# Patient Record
Sex: Male | Born: 1975 | Race: Black or African American | Hispanic: No | Marital: Single | Smoking: Current every day smoker
Health system: Southern US, Community
[De-identification: ages and names within clinical notes are randomized; demographics above are authoritative.]

## PROBLEM LIST (undated history)

## (undated) DIAGNOSIS — S46009A Unspecified injury of muscle(s) and tendon(s) of the rotator cuff of unspecified shoulder, initial encounter: Secondary | ICD-10-CM

## (undated) DIAGNOSIS — K2970 Gastritis, unspecified, without bleeding: Secondary | ICD-10-CM

## (undated) DIAGNOSIS — B9681 Helicobacter pylori [H. pylori] as the cause of diseases classified elsewhere: Secondary | ICD-10-CM

## (undated) DIAGNOSIS — K219 Gastro-esophageal reflux disease without esophagitis: Secondary | ICD-10-CM

## (undated) DIAGNOSIS — IMO0001 Reserved for inherently not codable concepts without codable children: Secondary | ICD-10-CM

## (undated) HISTORY — DX: Helicobacter pylori (H. pylori) as the cause of diseases classified elsewhere: B96.81

## (undated) HISTORY — PX: SHOULDER SURGERY: SHX246

## (undated) HISTORY — DX: Unspecified injury of muscle(s) and tendon(s) of the rotator cuff of unspecified shoulder, initial encounter: S46.009A

## (undated) HISTORY — DX: Reserved for inherently not codable concepts without codable children: IMO0001

## (undated) HISTORY — DX: Gastro-esophageal reflux disease without esophagitis: K21.9

## (undated) HISTORY — DX: Gastritis, unspecified, without bleeding: K29.70

---

## 1998-04-20 HISTORY — PX: CHOLECYSTECTOMY: SHX55

## 2001-01-03 ENCOUNTER — Emergency Department (HOSPITAL_COMMUNITY): Admission: EM | Admit: 2001-01-03 | Discharge: 2001-01-03 | Payer: Self-pay | Admitting: *Deleted

## 2001-05-02 ENCOUNTER — Emergency Department (HOSPITAL_COMMUNITY): Admission: EM | Admit: 2001-05-02 | Discharge: 2001-05-02 | Payer: Self-pay | Admitting: Emergency Medicine

## 2001-05-02 ENCOUNTER — Encounter: Payer: Self-pay | Admitting: Emergency Medicine

## 2001-06-27 ENCOUNTER — Encounter: Payer: Self-pay | Admitting: *Deleted

## 2001-06-27 ENCOUNTER — Emergency Department (HOSPITAL_COMMUNITY): Admission: EM | Admit: 2001-06-27 | Discharge: 2001-06-27 | Payer: Self-pay | Admitting: *Deleted

## 2002-11-16 ENCOUNTER — Emergency Department (HOSPITAL_COMMUNITY): Admission: EM | Admit: 2002-11-16 | Discharge: 2002-11-16 | Payer: Self-pay | Admitting: Emergency Medicine

## 2003-06-05 ENCOUNTER — Emergency Department (HOSPITAL_COMMUNITY): Admission: EM | Admit: 2003-06-05 | Discharge: 2003-06-05 | Payer: Self-pay | Admitting: Emergency Medicine

## 2003-08-03 ENCOUNTER — Ambulatory Visit (HOSPITAL_COMMUNITY): Admission: RE | Admit: 2003-08-03 | Discharge: 2003-08-03 | Payer: Self-pay | Admitting: Internal Medicine

## 2004-01-11 ENCOUNTER — Encounter: Payer: Self-pay | Admitting: Orthopedic Surgery

## 2004-03-06 ENCOUNTER — Ambulatory Visit: Payer: Self-pay | Admitting: Gastroenterology

## 2004-03-07 ENCOUNTER — Ambulatory Visit: Payer: Self-pay | Admitting: Internal Medicine

## 2004-03-07 ENCOUNTER — Ambulatory Visit (HOSPITAL_COMMUNITY): Admission: RE | Admit: 2004-03-07 | Discharge: 2004-03-07 | Payer: Self-pay | Admitting: Internal Medicine

## 2004-03-11 ENCOUNTER — Ambulatory Visit (HOSPITAL_COMMUNITY): Admission: RE | Admit: 2004-03-11 | Discharge: 2004-03-11 | Payer: Self-pay | Admitting: Internal Medicine

## 2004-06-06 ENCOUNTER — Emergency Department (HOSPITAL_COMMUNITY): Admission: EM | Admit: 2004-06-06 | Discharge: 2004-06-06 | Payer: Self-pay | Admitting: *Deleted

## 2004-06-18 ENCOUNTER — Ambulatory Visit: Payer: Self-pay | Admitting: Internal Medicine

## 2004-07-29 ENCOUNTER — Ambulatory Visit: Payer: Self-pay | Admitting: Family Medicine

## 2004-08-08 ENCOUNTER — Ambulatory Visit (HOSPITAL_COMMUNITY): Admission: RE | Admit: 2004-08-08 | Discharge: 2004-08-08 | Payer: Self-pay | Admitting: Family Medicine

## 2004-08-29 ENCOUNTER — Ambulatory Visit: Payer: Self-pay | Admitting: Family Medicine

## 2004-10-21 ENCOUNTER — Emergency Department (HOSPITAL_COMMUNITY): Admission: EM | Admit: 2004-10-21 | Discharge: 2004-10-22 | Payer: Self-pay | Admitting: *Deleted

## 2004-10-24 ENCOUNTER — Ambulatory Visit: Payer: Self-pay | Admitting: Family Medicine

## 2004-11-17 ENCOUNTER — Ambulatory Visit: Payer: Self-pay | Admitting: Internal Medicine

## 2004-11-24 ENCOUNTER — Ambulatory Visit: Payer: Self-pay | Admitting: Family Medicine

## 2004-12-30 ENCOUNTER — Ambulatory Visit (HOSPITAL_COMMUNITY): Admission: RE | Admit: 2004-12-30 | Discharge: 2004-12-30 | Payer: Self-pay | Admitting: Internal Medicine

## 2004-12-30 ENCOUNTER — Ambulatory Visit: Payer: Self-pay | Admitting: Internal Medicine

## 2005-01-23 ENCOUNTER — Ambulatory Visit: Payer: Self-pay | Admitting: Family Medicine

## 2005-02-11 ENCOUNTER — Other Ambulatory Visit: Admission: RE | Admit: 2005-02-11 | Discharge: 2005-02-11 | Payer: Self-pay | Admitting: Dermatology

## 2005-04-24 ENCOUNTER — Ambulatory Visit: Payer: Self-pay | Admitting: Family Medicine

## 2005-05-18 ENCOUNTER — Ambulatory Visit (HOSPITAL_COMMUNITY): Admission: RE | Admit: 2005-05-18 | Discharge: 2005-05-18 | Payer: Self-pay | Admitting: Family Medicine

## 2005-08-13 ENCOUNTER — Ambulatory Visit: Payer: Self-pay | Admitting: Internal Medicine

## 2005-11-16 ENCOUNTER — Emergency Department (HOSPITAL_COMMUNITY): Admission: EM | Admit: 2005-11-16 | Discharge: 2005-11-16 | Payer: Self-pay | Admitting: Emergency Medicine

## 2006-04-20 DIAGNOSIS — S46009A Unspecified injury of muscle(s) and tendon(s) of the rotator cuff of unspecified shoulder, initial encounter: Secondary | ICD-10-CM

## 2006-04-20 HISTORY — DX: Unspecified injury of muscle(s) and tendon(s) of the rotator cuff of unspecified shoulder, initial encounter: S46.009A

## 2006-04-20 HISTORY — PX: ROTATOR CUFF REPAIR: SHX139

## 2006-05-12 ENCOUNTER — Emergency Department (HOSPITAL_COMMUNITY): Admission: EM | Admit: 2006-05-12 | Discharge: 2006-05-12 | Payer: Self-pay | Admitting: Emergency Medicine

## 2006-08-11 ENCOUNTER — Ambulatory Visit: Payer: Self-pay | Admitting: Internal Medicine

## 2006-11-29 ENCOUNTER — Emergency Department (HOSPITAL_COMMUNITY): Admission: EM | Admit: 2006-11-29 | Discharge: 2006-11-29 | Payer: Self-pay | Admitting: Emergency Medicine

## 2006-12-02 ENCOUNTER — Ambulatory Visit: Payer: Self-pay | Admitting: Orthopedic Surgery

## 2006-12-07 ENCOUNTER — Encounter (HOSPITAL_COMMUNITY): Admission: RE | Admit: 2006-12-07 | Discharge: 2007-01-06 | Payer: Self-pay | Admitting: Orthopedic Surgery

## 2006-12-09 ENCOUNTER — Ambulatory Visit: Payer: Self-pay | Admitting: Orthopedic Surgery

## 2007-01-13 ENCOUNTER — Ambulatory Visit: Payer: Self-pay | Admitting: Orthopedic Surgery

## 2007-01-19 ENCOUNTER — Ambulatory Visit (HOSPITAL_COMMUNITY): Admission: RE | Admit: 2007-01-19 | Discharge: 2007-01-19 | Payer: Self-pay | Admitting: Orthopedic Surgery

## 2007-01-25 ENCOUNTER — Ambulatory Visit: Payer: Self-pay | Admitting: Orthopedic Surgery

## 2007-01-26 ENCOUNTER — Encounter: Payer: Self-pay | Admitting: Orthopedic Surgery

## 2007-01-26 DIAGNOSIS — M75120 Complete rotator cuff tear or rupture of unspecified shoulder, not specified as traumatic: Secondary | ICD-10-CM

## 2007-01-26 HISTORY — DX: Complete rotator cuff tear or rupture of unspecified shoulder, not specified as traumatic: M75.120

## 2007-02-04 ENCOUNTER — Ambulatory Visit: Payer: Self-pay | Admitting: Orthopedic Surgery

## 2007-02-04 ENCOUNTER — Ambulatory Visit (HOSPITAL_COMMUNITY): Admission: RE | Admit: 2007-02-04 | Discharge: 2007-02-04 | Payer: Self-pay | Admitting: Orthopedic Surgery

## 2007-02-08 ENCOUNTER — Encounter (HOSPITAL_COMMUNITY): Admission: RE | Admit: 2007-02-08 | Discharge: 2007-03-10 | Payer: Self-pay | Admitting: Orthopedic Surgery

## 2007-02-08 ENCOUNTER — Ambulatory Visit: Payer: Self-pay | Admitting: Orthopedic Surgery

## 2007-02-08 ENCOUNTER — Encounter (INDEPENDENT_AMBULATORY_CARE_PROVIDER_SITE_OTHER): Payer: Self-pay | Admitting: *Deleted

## 2007-02-08 DIAGNOSIS — M24119 Other articular cartilage disorders, unspecified shoulder: Secondary | ICD-10-CM

## 2007-02-08 HISTORY — DX: Other articular cartilage disorders, unspecified shoulder: M24.119

## 2007-02-23 ENCOUNTER — Encounter: Payer: Self-pay | Admitting: Orthopedic Surgery

## 2007-03-01 ENCOUNTER — Encounter: Payer: Self-pay | Admitting: Orthopedic Surgery

## 2007-03-08 ENCOUNTER — Encounter: Payer: Self-pay | Admitting: Orthopedic Surgery

## 2007-03-10 ENCOUNTER — Ambulatory Visit: Payer: Self-pay | Admitting: Orthopedic Surgery

## 2007-03-14 ENCOUNTER — Encounter (HOSPITAL_COMMUNITY): Admission: RE | Admit: 2007-03-14 | Discharge: 2007-04-13 | Payer: Self-pay | Admitting: Orthopedic Surgery

## 2007-03-15 ENCOUNTER — Encounter: Payer: Self-pay | Admitting: Orthopedic Surgery

## 2007-04-21 ENCOUNTER — Encounter: Payer: Self-pay | Admitting: Family Medicine

## 2007-11-17 ENCOUNTER — Emergency Department (HOSPITAL_COMMUNITY): Admission: EM | Admit: 2007-11-17 | Discharge: 2007-11-17 | Payer: Self-pay | Admitting: Emergency Medicine

## 2008-07-25 ENCOUNTER — Emergency Department (HOSPITAL_COMMUNITY): Admission: EM | Admit: 2008-07-25 | Discharge: 2008-07-25 | Payer: Self-pay | Admitting: Internal Medicine

## 2008-11-01 ENCOUNTER — Emergency Department (HOSPITAL_COMMUNITY): Admission: EM | Admit: 2008-11-01 | Discharge: 2008-11-01 | Payer: Self-pay | Admitting: Emergency Medicine

## 2008-11-02 ENCOUNTER — Ambulatory Visit: Payer: Self-pay | Admitting: Gastroenterology

## 2008-11-02 DIAGNOSIS — R079 Chest pain, unspecified: Secondary | ICD-10-CM

## 2008-11-06 ENCOUNTER — Ambulatory Visit (HOSPITAL_COMMUNITY): Admission: RE | Admit: 2008-11-06 | Discharge: 2008-11-06 | Payer: Self-pay | Admitting: Gastroenterology

## 2008-11-06 ENCOUNTER — Encounter: Payer: Self-pay | Admitting: Gastroenterology

## 2008-11-06 ENCOUNTER — Ambulatory Visit: Payer: Self-pay | Admitting: Gastroenterology

## 2009-01-10 ENCOUNTER — Encounter (INDEPENDENT_AMBULATORY_CARE_PROVIDER_SITE_OTHER): Payer: Self-pay | Admitting: *Deleted

## 2009-03-13 DIAGNOSIS — K219 Gastro-esophageal reflux disease without esophagitis: Secondary | ICD-10-CM

## 2009-03-13 DIAGNOSIS — Z8719 Personal history of other diseases of the digestive system: Secondary | ICD-10-CM

## 2009-03-13 DIAGNOSIS — F17200 Nicotine dependence, unspecified, uncomplicated: Secondary | ICD-10-CM

## 2009-03-13 HISTORY — DX: Personal history of other diseases of the digestive system: Z87.19

## 2009-03-13 HISTORY — DX: Gastro-esophageal reflux disease without esophagitis: K21.9

## 2009-03-19 ENCOUNTER — Ambulatory Visit: Payer: Self-pay | Admitting: Gastroenterology

## 2009-03-19 DIAGNOSIS — A048 Other specified bacterial intestinal infections: Secondary | ICD-10-CM

## 2009-03-19 HISTORY — DX: Other specified bacterial intestinal infections: A04.8

## 2009-09-10 ENCOUNTER — Emergency Department (HOSPITAL_COMMUNITY): Admission: EM | Admit: 2009-09-10 | Discharge: 2009-09-10 | Payer: Self-pay | Admitting: Emergency Medicine

## 2009-10-01 ENCOUNTER — Telehealth (INDEPENDENT_AMBULATORY_CARE_PROVIDER_SITE_OTHER): Payer: Self-pay

## 2009-10-01 ENCOUNTER — Ambulatory Visit: Payer: Self-pay | Admitting: Gastroenterology

## 2009-10-01 DIAGNOSIS — B370 Candidal stomatitis: Secondary | ICD-10-CM | POA: Insufficient documentation

## 2010-03-06 ENCOUNTER — Encounter (INDEPENDENT_AMBULATORY_CARE_PROVIDER_SITE_OTHER): Payer: Self-pay | Admitting: *Deleted

## 2010-03-15 ENCOUNTER — Emergency Department (HOSPITAL_COMMUNITY): Admission: EM | Admit: 2010-03-15 | Discharge: 2010-03-16 | Payer: Self-pay | Admitting: Emergency Medicine
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
## 2010-05-20 NOTE — Letter (Signed)
Summary: Recall Office Visit  [facility]  [address]   [place], [place] [address]   Phone: [phone]  Fax: [phone]      March 06, 2010   Billy Lynn [address] [place], [place]  [address] 10/27/1975   Dear Mr. KIDD,   According to our records, it is time for you to schedule a follow-up office visit with [place].   At your convenience, please call [phone] to schedule an office visit. If you have any questions, concerns, or feel that this letter is in error, we would appreciate your call.   Sincerely,    Diana Eves  [facility] Ph: [phone]   Fax: [phone]

## 2010-05-20 NOTE — Progress Notes (Signed)
Summary: CELL NUMBER IF NEEDED...[phone]  Phone Note Call from Patient   Summary of Call: Pt said his cell number is sometimes a better number to reach him at.   [phone]. Initial call taken by: Cloria Spring LPN,  October 01, 2009 2:16 PM     Appended Document: CELL NUMBER IF NEEDED...[phone] new cell number is in IDX

## 2010-05-20 NOTE — Letter (Signed)
Summary: rpc chart  rpc chart   Imported By: Curtis Sites 02/05/2010 11:29:39  _____________________________________________________________________  External Attachment:    Type:   Image     Comment:   External Document

## 2010-06-20 ENCOUNTER — Encounter: Payer: Self-pay | Admitting: Orthopedic Surgery

## 2010-06-24 ENCOUNTER — Ambulatory Visit: Payer: Self-pay | Admitting: Orthopedic Surgery

## 2010-06-24 ENCOUNTER — Encounter: Payer: Self-pay | Admitting: Orthopedic Surgery

## 2010-07-07 LAB — CBC
HCT: 46.1 % (ref 39.0–52.0)
Platelets: 176 10*3/uL (ref 150–400)
RBC: 5.98 MIL/uL — ABNORMAL HIGH (ref 4.22–5.81)
WBC: 6.7 10*3/uL (ref 4.0–10.5)

## 2010-07-07 LAB — COMPREHENSIVE METABOLIC PANEL
Alkaline Phosphatase: 58 U/L (ref 39–117)
BUN: 13 mg/dL (ref 6–23)
Calcium: 9.3 mg/dL (ref 8.4–10.5)
GFR calc Af Amer: >60 mL/min (ref >60)
GFR calc non Af Amer: >60 mL/min (ref >60)
Total Bilirubin: 0.5 mg/dL (ref 0.3–1.2)

## 2010-07-07 LAB — URINALYSIS, ROUTINE W REFLEX MICROSCOPIC
Hgb urine dipstick: NEGATIVE
Nitrite: NEGATIVE
Protein, ur: NEGATIVE mg/dL
Specific Gravity, Urine: 1.02 (ref 1.005–1.030)
Urobilinogen, UA: 0.2 mg/dL (ref 0.0–1.0)

## 2010-07-07 LAB — LIPASE, BLOOD: Lipase: 29 U/L (ref 11–59)

## 2010-07-07 LAB — DIFFERENTIAL
Basophils Absolute: 0.1 10*3/uL (ref 0.0–0.1)
Basophils Relative: 1 % (ref 0–1)
Eosinophils Relative: 5 % (ref 0–5)
Lymphs Abs: 2.6 10*3/uL (ref 0.7–4.0)
Monocytes Absolute: 0.4 10*3/uL (ref 0.1–1.0)
Neutrophils Relative %: 49 % (ref 43–77)

## 2010-07-09 ENCOUNTER — Encounter: Payer: Self-pay | Admitting: Orthopedic Surgery

## 2010-07-15 ENCOUNTER — Ambulatory Visit (INDEPENDENT_AMBULATORY_CARE_PROVIDER_SITE_OTHER): Payer: BC Managed Care – PPO | Admitting: Orthopedic Surgery

## 2010-07-15 ENCOUNTER — Encounter: Payer: Self-pay | Admitting: Orthopedic Surgery

## 2010-07-15 DIAGNOSIS — M25539 Pain in unspecified wrist: Secondary | ICD-10-CM

## 2010-07-15 DIAGNOSIS — S63599A Other specified sprain of unspecified wrist, initial encounter: Secondary | ICD-10-CM

## 2010-07-15 MED ORDER — NABUMETONE 500 MG PO TABS: 500.0000 mg | ORAL_TABLET | Freq: Two times a day (BID) | ORAL | Status: DC

## 2010-07-15 NOTE — Patient Instructions (Signed)
Brace x  6 weeks   Take medication x 6 weeks
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
## 2010-08-26 ENCOUNTER — Ambulatory Visit: Payer: BC Managed Care – PPO | Admitting: Orthopedic Surgery

## 2010-09-02 NOTE — Op Note (Signed)
Billy Lynn, HENDERSHOTT              ACCOUNT NO.:  [id number]   MEDICAL RECORD NO.:  [id number]          PATIENT TYPE:  AMB   LOCATION:  DAY                           FACILITY:  [facility]   PHYSICIAN:  Kassie Mends, M.D.      DATE OF BIRTH:  1975/10/09   DATE OF PROCEDURE:  11/06/2008  DATE OF DISCHARGE:  11/06/2008                               OPERATIVE REPORT   REFERRING PHYSICIAN:  Tesfaye D. Felecia Shelling, MD   PROCEDURE:  Esophagogastroduodenoscopy with cold forceps biopsy.   INDICATION FOR EXAM:  Mr. Babington is a 35 year old male, who was referred  to me for reflux.  He is complaining of chest pain.  He has had chest  pain which has been secondary to nonerosive gastritis and  musculoskeletal chest wall pain, off and on since 2004.  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  He had EGD in 2006 with a 48-hour Bravo.  It showed a normal  esophagus and the Bravo study showed no evidence of uncontrolled  gastroesophageal reflux disease.  He had a cholecystectomy in 2000 for  similar symptoms, but his chest was not hurting as much.  He was asked  to take ibuprofen and his symptoms have improved.   FINDINGS:  1. Normal esophagus without evidence of Barrett's, mass, erosion,      ulceration, or stricture.  2. Patchy erythema in the antrum without evidence of erosion or      ulceration.  Biopsies obtained via cold forceps to evaluate for H.      pylori gastritis.  He also had the submucosal lesion.  It was      approximately 6-mm in size.  Biopsies obtained via cold forceps and      sent in a separate bottle.  3. Patchy erythema with occasional erosions seen in the duodenum.   DIAGNOSES:  1. Gastritis and duodenitis:  Differential includes NSAID-induced      gastrointestinal injury or Helicobacter pylori gastritis.  2. 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   RECOMMENDATIONS:  1. He should add omeprazole 20 mg daily for the next 3  months.  He is      to take ibuprofen 800 mg 3 times a day for 10 days total.  2. Will call him with the results of his biopsies.  3. He already has a follow up appointment to see me in 2 months.   MEDICATIONS:  1. Demerol 150 mg IV.  2. Versed 8 mg IV.  3. Phenergan 12.5 mg IV.   PROCEDURE TECHNIQUE:  Physical exam was performed.  Informed consent was  obtained from the patient after explaining the benefits, risks, and  alternatives to the procedure.  The patient was connected to the monitor  and placed in left lateral position.  Continuous oxygen was provided via  nasal cannula.  IV medicine administered through an indwelling cannula.  After administration of sedation, the patient's esophagus was intubated  and scope was advanced under direct visualization to the second portion  of the duodenum.  The scope  was removed slowly by carefully examining  the color, texture, anatomy, and integrity of the mucosa on the way out.  The patient was recovered in endoscopy and discharged home in  satisfactory condition.   PATH:  H. PYLORI GASTRITIS, RX: ABO bid x 10 days      Kassie Mends, M.D.  Electronically Signed     SM/MEDQ  D:  11/06/2008  T:  11/07/2008  Job:  [id number]   cc:   Tesfaye D. Felecia Shelling, MD  Fax: [phone]
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
## 2010-09-05 NOTE — H&P (Signed)
[name], Lynn              ACCOUNT NO.:  [id number]   MEDICAL RECORD NO.:  [id number]           PATIENT TYPE:   LOCATION:                                 FACILITY:   PHYSICIAN:  Lionel December, M.D.         DATE OF BIRTH:   DATE OF ADMISSION:  DATE OF DISCHARGE:  LH                                HISTORY & PHYSICAL   CHIEF COMPLAINT:  Gagging.   HISTORY OF PRESENT ILLNESS:  The patient is a 35 year old black male who  presents today for further evaluation of the above-stated symptoms.  He was  initially seen in February of 2004 for the same symptoms.  At that time, he  had a partial response with Nexium daily. He tells me that he gags after  most meals, especially fried, fatty foods.  Upon more description, it sounds  like he is heaving, but he is not actually having emesis.  He denies any  dysphagia or odynophagia.  He has some pain in the sternum region; however,  this seems to be worse with stretching or movement of his upper body.  He  complains of epigastric pain.  He is taking hydrocodone for this.  He denies  any typical heartburn symptoms.  His bowels move regularly, although he  tells me he has up to four formed to loose stools daily.  These occur  primarily postprandially.  He denies any melena or rectal bleeding.  He  denies any significant weight loss.   CURRENT MEDICATIONS:  Hydrocodone p.r.n.   ALLERGIES:  No known drug allergies.   PAST MEDICAL HISTORY:  Negative for chronic illnesses.   PAST SURGICAL HISTORY:  Cholecystectomy in 2000.  Family history negative  for colorectal cancer or chronic GI illnesses.   SOCIAL HISTORY:  He has a daughter.  He is employed by the [employer].  He smokes six cigarettes a day.  He denies any alcohol use.   REVIEW OF SYSTEMS:  See HPI for GI and for general.  CARDIOPULMONARY:  Denies shortness of breath or exertional chest pain.  Denies cough.   PHYSICAL EXAMINATION:  VITAL SIGNS:  Weight 150, height  5 feet, 6 inches.  Blood pressure 130/90, pulse 72.  GENERAL: A pleasant, well-nourished, well-developed black male, in no acute  distress.  SKIN:  Warm and dry, no jaundice.  HEENT:  Conjunctivae are pink.  Sclerae are nonicteric.  Oropharyngeal  mucosa moist and pink, no lesions, erythema or exudate.  No lymphadenopathy  or thyromegaly.  CHEST:  Lungs clear to auscultation.  CARDIAC:  Exam reveals regular rate and rhythm, normal S1 and S2.  No  murmurs, rubs or gallops.  ABDOMEN:  Positive bowel sounds, soft, nondistended.  Mild epigastric  tenderness to deep palpation.  He has a fullness noted in the right lower  quadrant just right at the midline.  No discrete mass is appreciated.  No  abdominal hernia, rebound tenderness or guarding.  EXTREMITIES:  No edema.   IMPRESSION:  1.  Billy Lynn is a pleasant 35 year old black gentleman with recurrent nausea  and gagging primarily after meals.  This has been intermittent in      nature now for almost two years.  Previously, his symptoms partially      responded to Nexium.  He does not have any typical heartburn symptoms.      However, would have to question if his symptoms are related to      gastroesophageal reflux disease.  Not noted above, he has gone back on      Nexium, but more or less on a p.r.n. basis, and has noted only mild      improvement in the symptoms.  In addition, he complains of postprandial      loose stools. Suspect he has IBS.  2.  On physical examination, he is noted to have a fullness in the right      lower quadrant lesion.  No discrete mass is appreciative.  This area is      nontender as well.  It needs to be further investigated via radiological      evaluation.   PLAN:  1.  Nexium 40 mg p.o. daily, #20 samples.  2.  Trial of NuLev, one sublingual q.a.c. p.r.n. #16, samples.  3.  CT of the abdomen and pelvis with IV and oral contrast.  4.  Met-7, LFT's, CBC, EGD in the near future.     Lesl   LL/MEDQ   D:  03/06/2004  T:  03/06/2004  Job:  [id number]

## 2010-09-05 NOTE — Op Note (Signed)
Billy Lynn, Billy Lynn              ACCOUNT NO.:  [id number]   MEDICAL RECORD NO.:  [id number]          PATIENT TYPE:  AMB   LOCATION:  DAY                           FACILITY:  [facility]   PHYSICIAN:  Lionel December, M.D.    DATE OF BIRTH:  15-Jun-1975   DATE OF PROCEDURE:  DATE OF DISCHARGE:                                 OPERATIVE REPORT   PROCEDURE:  Esophagogastroduodenoscopy with placement of Bravo device for 48-  hour pH study.   INDICATIONS:  [name] is a 35 year old African-American male with a several-  month history of burning chest and epigastric pain. He also has had  regurgitation. We felt that he has reflux esophagitis, although he has not  responded to double-dose PPI therapy along with entire reflux measures. He  also has undergone therapy for H. pylori gastritis but without any benefit.  He is, therefore, undergoing this study off PPI therapy. He has been off  Nexium for 7 days. Procedure and risks were reviewed the patient, informed  consent was obtained.   PREMEDICATION:  Cetacaine spray for oropharyngeal topical anesthesia Demerol  50 mg IV Versed 18 mg IV.   FINDINGS:  Procedure performed in endoscopy suite. The patient's vital signs  and O2 saturations were monitored during procedure and remained stable. The  patient was placed in the left lateral position and Olympus videoscope was  passed via oropharynx, without any difficulty, into esophagus.   ESOPHAGUS:  Mucosa of the esophagus normal throughout. GE junction was at 40  cm from the incisors. Pictures taken for the record.   STOMACH:  It was empty and distended very well on insufflation. Folds of the  proximal stomach were normal. Examination of the mucosa revealed some  mucosal lesion at antrum along the posterior wall with central depression or  umblication .  On previous study, it was felt to be a leiomyoma and he had  erosion on top. Picture was taken for the record and comparison made with  pictures from  the prior study. The pyloric channel was patent. Angularis,  fundus and cardia were normal.   DUODENUM:  Bulbar mucosa was normal. Scope was passed to the second part of  the duodenum where mucosa and folds were normal. Endoscope was withdrawn.   Bravo device already loaded onto the delivery system was calibrated and  passed via oropharynx, without any difficulty, into esophagus. The delivery  system was connected to suction which was maintained for 30 seconds. Plunger  was pushed to secure the Bravo device to esophageal mucosa and was turned  clockwise to disengage the delivery system and was gently pulled which was  then gradually withdrawn. Endoscope was passed again and Bravo device was in  good place. Endoscope was in good position. Endoscope was withdrawn. The  patient tolerated the procedure well.   FINAL DIAGNOSIS:  1.  Normal examination of the esophagus.  2.  Antral submucosal lesion with central depression or umblication felt to      be a leiomyoma size, stable since previous EGD of November2005.  3.  Bravo device placed at distal esophagus for pH study.  RECOMMENDATIONS:  1.  Patient advised not to take PPIs or H2B's while the study is being      carried out.  2.  He will keep a diary and he will return to short stay center in 48 hours      Lionel December, M.D.  Electronically Signed     NR/MEDQ  D:  12/30/2004  T:  12/31/2004  Job:  [id number]   cc:   Dorthula Rue. Early Chars, MD  Fax: [phone]

## 2010-09-05 NOTE — Op Note (Signed)
NAMEJAZ, Billy Lynn              ACCOUNT NO.:  [id number]   MEDICAL RECORD NO.:  [id number]          PATIENT TYPE:  AMB   LOCATION:  DAY                           FACILITY:  [facility]   PHYSICIAN:  Lionel December, M.D.    DATE OF BIRTH:  March 20, 1976   DATE OF PROCEDURE:  12/30/2004  DATE OF DISCHARGE:  12/30/2004                                 OPERATIVE REPORT   PROCEDURE:  Bravo pH monitoring study.   INDICATION:  [name] is a 35 year old Afro-American male who has symptoms of  gastroesophageal reflux disease. However, he has not responded to double  dose PPI. His EGD has essentially unremarkable. In the past, he has been  treated for H pylori gastritis. He had a Bravo device placed in his distal  esophagus per protocol two days ago. He has been off PPI for seven days.   FINDINGS:  Day #1 analysis:   Study duration 20 hours and 17 minutes.   Number of reflux episodes 25, all of which incurred in upright position.   Reflux episodes longer than 5 minutes, 0.   Duration of longest reflux episode 2 minutes. Time pH less than 4 is 12  minutes.   Fraction time pH less than 4 is 1.0%.   DeMeester score is 4.0.   Day #2 analysis.   Duration of study 22 hours and 46 minutes.   Number of reflux episodes in the study, 24, all of which occurred an upright  position.   Number of reflux episodes greater than 5 minutes, 0.   Duration of longest reflux episode was 1 minute.   Time PH below 4 is 11 minutes. Fraction time PH below 4 is 0.8%.   DeMeester score is 3.4.   Aggregate two-day analysis:   Study duration is 43 hours and 4 minutes.   Number of reflux episodes is 49, all of which occurred in upright position.   Number of reflux episodes greater than 5 minutes, 0.   Duration of longest reflux episode 2 minutes.   Time pH less than 4 is 24 minutes. Fraction time pH less than 4 is 0.9%.   The patient reported single episode of chest pain, and acid was documented  in his  esophagus.   He reported single episode of regurgitation, and acid was documented in his  esophagus.   He reported no episode of heartburn.   IMPRESSION:  This is a normal study. He has esophageal acid exposure within  physiologic range.   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   RECOMMENDATIONS:  The patient was reassured. Would let him go back on PPI  once a day for another 12-16 weeks, and he could use Levsin SL t.i.d. p.r.n.  chest pain.   If he remains symptomatic, would consider referral to tertiary center.      Lionel December, M.D.  Electronically Signed     NR/MEDQ  D:  01/06/2005  T:  01/06/2005  Job:  [id number]

## 2010-09-05 NOTE — Op Note (Signed)
NAMEDREYTON, ROESSNER              ACCOUNT NO.:  [id number]   MEDICAL RECORD NO.:  [id number]          PATIENT TYPE:  AMB   LOCATION:  DAY                           FACILITY:  [facility]   PHYSICIAN:  Lionel December, M.D.    DATE OF BIRTH:  1975/09/07   DATE OF PROCEDURE:  03/07/2004  DATE OF DISCHARGE:                                 OPERATIVE REPORT   PROCEDURE:  Esophagogastroduodenoscopy.   Billy Lynn is a 35 year old, African-American male with recurrent episodes of  postprandial gagging, epigastric pain.  He has had partial response to  Nexium.  This apparently has been going on for a few years intermittently.  He had a laparoscopic cholecystectomy five years ago without symptomatic  improvement.  He is undergoing a diagnostic EGD.  The procedure risks were  reviewed, and informed consent was obtained.   PREMEDICATION:  Cetacaine spray for pharyngeal topical anesthesia, Demerol  50 mg IV, Versed 12 mg IV.   FINDINGS:  Procedure performed in endoscopy suite.  The patient's vital  signs and O2 saturation were monitored during the procedure and remained  stable.  The patient was placed in the left lateral recumbent position and  the Olympus video scope was passed via oropharynx without any difficulty  into esophagus.   Esophagus:  Mucosa of the esophagus was normal throughout.  The  squamocolumnar junction was normal at 41 cm from the incisors.   Stomach:  It was empty and distended very well with insufflation.  Folds of  the proximal stomach were normal.  Examination of the mucosa revealed patchy  erythema and granularity at antrum.  There was a submucosal lesion about 10  x 15 mm with a single erosion over it.  This was along the posterior wall of  the antrum.  The appearance was typical of leiomyoma.  It was not biopsied.  The pyloric channel was patent.  Angularis, fundus, and cardia were examined  by retroflexing the scope and were normal.   Duodenum:  Examination of the bulb  revealed normal mucosa.  The scope was  passed to the second part of the duodenum, where mucosa and folds are  normal.  The endoscope was withdrawn.  The patient tolerated the procedure  well.   FINAL DIAGNOSES:  1.  Nonerosive antral gastritis.  2.  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  3.  As far as patient's symptoms are concerned, this may be a vagal response      or even a psychosomatic symptom.   RECOMMENDATIONS:  1.  A H. pylori serology will be checked today.  2.  Levbid one tablet before breakfast daily.  I have asked Tionne to keep      a record as to the frequency and the      setting of these episodes.  Unless he has side effects with Levbid, he      will stay on it for three months, at which time he will be evaluated in      the office.  3.  He should undergo a follow-up EGD  in two years to make sure antral      leiomyoma had not doubled in size.     Billy Lynn   NR/MEDQ  D:  03/07/2004  T:  03/07/2004  Job:  [id number]   cc:   Loel Dubonnet, M.D.

## 2010-09-09 ENCOUNTER — Ambulatory Visit: Payer: BC Managed Care – PPO | Admitting: Orthopedic Surgery

## 2010-09-24 ENCOUNTER — Ambulatory Visit (INDEPENDENT_AMBULATORY_CARE_PROVIDER_SITE_OTHER): Payer: BC Managed Care – PPO | Admitting: Orthopedic Surgery

## 2010-09-24 ENCOUNTER — Encounter: Payer: Self-pay | Admitting: Orthopedic Surgery

## 2010-09-24 DIAGNOSIS — M24139 Other articular cartilage disorders, unspecified wrist: Secondary | ICD-10-CM

## 2010-09-24 DIAGNOSIS — S6980XA Other specified injuries of unspecified wrist, hand and finger(s), initial encounter: Secondary | ICD-10-CM

## 2010-09-24 NOTE — Progress Notes (Signed)
Visit  LEFT wrist pain  Treatment edema tone  Complaints mild discomfort dorsum of the hand over the triquetral area  Normal wrist flexion no palpable tenderness no swelling normal neurovascular function no numbness no tingling normal grip strength.  Wrist is stable.  Impression ulnar-sided wrist pain control nabumetone  Followup as needed continue nabumetone.

## 2010-09-24 NOTE — Patient Instructions (Signed)
Keep taking medication  If pain gets worse give [place] a call to make appt

## 2011-01-14 ENCOUNTER — Other Ambulatory Visit: Payer: Self-pay

## 2011-01-14 MED ORDER — OMEPRAZOLE 20 MG PO TBEC: 20.0000 mg | DELAYED_RELEASE_TABLET | Freq: Every day | ORAL | Status: DC

## 2011-01-28 LAB — HEMOGLOBIN AND HEMATOCRIT, BLOOD: HCT: 48

## 2011-09-23 ENCOUNTER — Other Ambulatory Visit (HOSPITAL_COMMUNITY): Payer: Self-pay | Admitting: Internal Medicine

## 2011-09-23 ENCOUNTER — Ambulatory Visit (HOSPITAL_COMMUNITY)
Admission: RE | Admit: 2011-09-23 | Discharge: 2011-09-23 | Disposition: A | Discharge status: Home / Self Care | Payer: BC Managed Care – PPO | Source: Ambulatory Visit | Attending: Internal Medicine | Admitting: Internal Medicine

## 2011-09-23 DIAGNOSIS — M542 Cervicalgia: Secondary | ICD-10-CM | POA: Insufficient documentation

## 2011-10-28 ENCOUNTER — Ambulatory Visit (HOSPITAL_COMMUNITY)
Admission: RE | Admit: 2011-10-28 | Discharge: 2011-10-28 | Disposition: A | Discharge status: Home / Self Care | Payer: BC Managed Care – PPO | Source: Ambulatory Visit | Attending: Internal Medicine | Admitting: Internal Medicine

## 2011-10-28 DIAGNOSIS — M542 Cervicalgia: Secondary | ICD-10-CM | POA: Insufficient documentation

## 2011-10-28 DIAGNOSIS — M6281 Muscle weakness (generalized): Secondary | ICD-10-CM | POA: Insufficient documentation

## 2011-10-28 DIAGNOSIS — IMO0001 Reserved for inherently not codable concepts without codable children: Secondary | ICD-10-CM | POA: Insufficient documentation

## 2011-10-29 DIAGNOSIS — M7918 Myalgia, other site: Secondary | ICD-10-CM | POA: Insufficient documentation

## 2011-10-29 DIAGNOSIS — M542 Cervicalgia: Secondary | ICD-10-CM | POA: Insufficient documentation
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
## 2011-11-09 ENCOUNTER — Ambulatory Visit (HOSPITAL_COMMUNITY)
Admission: RE | Admit: 2011-11-09 | Discharge: 2011-11-09 | Disposition: A | Discharge status: Home / Self Care | Payer: BC Managed Care – PPO | Source: Ambulatory Visit | Attending: Physical Therapy | Admitting: Physical Therapy

## 2011-11-09 NOTE — Progress Notes (Signed)
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 No pain reported currently, however states it gets worse at night into the evening and is hard for him to sleep. Pain Assessment Currently in Pain?: No/denies   Exercise/Treatments Stretches Upper Trapezius Stretch: 2 reps;30 seconds Levator Stretch: 2 reps;30 seconds Machines for Strengthening UBE (Upper Arm Bike): 4' backward Theraband Exercises Scapula Retraction: 10 reps;Green Shoulder Extension: 10 reps;Green Rows: 10 reps;Red Standing Exercises Other Standing Exercises: corner elbow presses 10X5" holds Seated Exercises Neck Retraction: 10 reps    Modalities Modalities: Electrical Stimulation;Cryotherapy Moist Heat Therapy Moist Heat Location: Hand Cryotherapy Number Minutes Cryotherapy: 15 Minutes Cryotherapy Location: Neck Type of Cryotherapy: Ice pack Pharmacologist Location: L side of cervical region  Electrical Stimulation Action: IFES to decrease pain  Electrical Stimulation Parameters: 8.5-9 volts x15 minutes w/ice  Electrical Stimulation Goals: Pain  Physical Therapy Assessment and Plan PT Assessment and Plan Clinical Impression Statement: Pt. reported painfree at end of session.  Added tband with cues for posture/form.  No difficulties with other exercises, unable to recall/perform stretches correctly. PT Plan: Continue per POC; add prone exercises next visit.     Problem List Patient Active Problem List  Diagnosis  . HELICOBACTER PYLORI GASTRITIS  . CANDIDIASIS, ORAL  . CIGARETTE SMOKER  . GERD  . DISORDER,  ARTICULAR CRLTG , SHOULDER  . RUPTURE ROTATOR CUFF  . CHEST PAIN  . DYSPEPSIA, HX OF  . Neck pain  . Musculoskeletal pain    PT - End of Session Activity Tolerance: Patient tolerated treatment well General Behavior During Session: [facility] for tasks performed Cognition: [facility] for tasks performed   Lurena Nida, PTA/CLT 11/09/2011, 5:23 PM

## 2011-11-10 ENCOUNTER — Ambulatory Visit (HOSPITAL_COMMUNITY): Payer: BC Managed Care – PPO | Admitting: Physical Therapy

## 2011-11-16 ENCOUNTER — Ambulatory Visit (HOSPITAL_COMMUNITY)
Admission: RE | Admit: 2011-11-16 | Discharge: 2011-11-16 | Payer: BC Managed Care – PPO | Source: Ambulatory Visit | Attending: Internal Medicine | Admitting: Internal Medicine

## 2011-11-16 NOTE — Progress Notes (Signed)
Physical Therapy Treatment Patient Details  Name: Billy Lynn MRN: [id number] Date of Birth: 02-Sep-1975  Today's Date: 11/16/2011 Time: 6295-2841 PT Time Calculation (min): 48 min Charges:  therex 25', estim unattended X 1 unit Visit#: 3  of 8   Re-eval: 11/27/11   Subjective: Symptoms/Limitations Symptoms: 7/10 pain and stiffness from working today.  Pt. states the estim and ice are the only relief he's getting. Pain Assessment Currently in Pain?: Yes Pain Score:   7 Pain Location: Neck Pain Orientation: Left   Exercise/Treatments Machines for Strengthening UBE (Upper Arm Bike): 5' backward Theraband Exercises Scapula Retraction: 10 reps;Blue Shoulder Extension: 10 reps;Blue Rows: 10 reps;Blue Standing Exercises Other Standing Exercises: corner elbow presses 10X5" holds Seated Exercises Neck Retraction: 10 reps Prone Exercises Neck Retraction: 10 reps W Back: 10 reps Shoulder Extension: 10 reps Rows: 10 reps   Modalities Modalities: Archivist Stimulation Location: L side of cervical region Electrical Stimulation Action: IFES, hi/lo sweep to decrease pain.  Pt. positioned supine Electrical Stimulation Parameters: 8.5-9 volts x15 minutes  Electrical Stimulation Goals: Pain  Physical Therapy Assessment and Plan PT Assessment and Plan Clinical Impression Statement: Added prone exercises to strengthen postural/cervical muscles.  Pt. able to complete without c/o pain.  Pt. requested trying IFES without ice today; reported pain reduction at end of session.; able to move neck freely without complaint or facial grimacing throughout session. PT Plan: Continue POC; may try massage if no lasting relief from estim.     Problem List Patient Active Problem List  Diagnosis  . HELICOBACTER PYLORI GASTRITIS  . CANDIDIASIS, ORAL  . CIGARETTE SMOKER  . GERD  . DISORDER, ARTICULAR CRLTG , SHOULDER  . RUPTURE ROTATOR CUFF  .  CHEST PAIN  . DYSPEPSIA, HX OF  . Neck pain  . Musculoskeletal pain    PT - End of Session Activity Tolerance: Patient tolerated treatment well General Behavior During Session: [facility] for tasks performed Cognition: [facility] for tasks performed   Lurena Nida, PTA/CLT 11/16/2011, 5:45 PM

## 2011-11-18 ENCOUNTER — Ambulatory Visit (HOSPITAL_COMMUNITY): Payer: BC Managed Care – PPO | Admitting: Physical Therapy

## 2011-11-23 ENCOUNTER — Ambulatory Visit (HOSPITAL_COMMUNITY): Payer: BC Managed Care – PPO | Admitting: Physical Therapy

## 2011-11-25 ENCOUNTER — Ambulatory Visit (HOSPITAL_COMMUNITY): Payer: BC Managed Care – PPO | Admitting: Physical Therapy

## 2011-11-30 ENCOUNTER — Ambulatory Visit (HOSPITAL_COMMUNITY): Payer: BC Managed Care – PPO | Admitting: Physical Therapy

## 2011-12-02 ENCOUNTER — Ambulatory Visit (HOSPITAL_COMMUNITY): Payer: BC Managed Care – PPO | Admitting: Physical Therapy

## 2012-04-07 ENCOUNTER — Emergency Department (HOSPITAL_COMMUNITY)
Admission: EM | Admit: 2012-04-07 | Discharge: 2012-04-07 | Disposition: A | Discharge status: Home / Self Care | Payer: BC Managed Care – PPO | Attending: Emergency Medicine | Admitting: Emergency Medicine

## 2012-04-07 ENCOUNTER — Encounter (HOSPITAL_COMMUNITY): Payer: Self-pay | Admitting: *Deleted

## 2012-04-07 DIAGNOSIS — K219 Gastro-esophageal reflux disease without esophagitis: Secondary | ICD-10-CM | POA: Insufficient documentation

## 2012-04-07 DIAGNOSIS — R300 Dysuria: Secondary | ICD-10-CM | POA: Insufficient documentation

## 2012-04-07 DIAGNOSIS — Z87828 Personal history of other (healed) physical injury and trauma: Secondary | ICD-10-CM | POA: Insufficient documentation

## 2012-04-07 DIAGNOSIS — Z79899 Other long term (current) drug therapy: Secondary | ICD-10-CM | POA: Insufficient documentation

## 2012-04-07 DIAGNOSIS — F17200 Nicotine dependence, unspecified, uncomplicated: Secondary | ICD-10-CM | POA: Insufficient documentation

## 2012-04-07 LAB — URINALYSIS, ROUTINE W REFLEX MICROSCOPIC
Bilirubin Urine: NEGATIVE
Hgb urine dipstick: NEGATIVE
Ketones, ur: NEGATIVE mg/dL

## 2012-04-07 NOTE — ED Notes (Signed)
"  feels funny when I Pee"  Denies d/c alert,  No fever or chills
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
## 2012-04-07 NOTE — ED Provider Notes (Signed)
Medical screening examination/treatment/procedure(s) were performed by non-physician practitioner and as supervising physician I was immediately available for consultation/collaboration.  Jones Skene, M.D.     Jones Skene, MD 04/07/12 1857

## 2012-04-07 NOTE — ED Notes (Signed)
Instructions and f/u information given/reviewed.  Verbalizes understanding.

## 2012-04-09 LAB — URINE CULTURE: Culture: NO GROWTH

## 2012-05-30 ENCOUNTER — Emergency Department (HOSPITAL_COMMUNITY)
Admission: EM | Admit: 2012-05-30 | Discharge: 2012-05-30 | Disposition: A | Discharge status: Home / Self Care | Payer: BC Managed Care – PPO | Attending: Emergency Medicine | Admitting: Emergency Medicine

## 2012-05-30 ENCOUNTER — Emergency Department (HOSPITAL_COMMUNITY): Payer: BC Managed Care – PPO

## 2012-05-30 ENCOUNTER — Encounter (HOSPITAL_COMMUNITY): Payer: Self-pay

## 2012-05-30 DIAGNOSIS — Z87828 Personal history of other (healed) physical injury and trauma: Secondary | ICD-10-CM | POA: Insufficient documentation

## 2012-05-30 DIAGNOSIS — Z8719 Personal history of other diseases of the digestive system: Secondary | ICD-10-CM | POA: Insufficient documentation

## 2012-05-30 DIAGNOSIS — M542 Cervicalgia: Secondary | ICD-10-CM | POA: Insufficient documentation

## 2012-05-30 DIAGNOSIS — F17200 Nicotine dependence, unspecified, uncomplicated: Secondary | ICD-10-CM | POA: Insufficient documentation

## 2012-05-30 DIAGNOSIS — G8929 Other chronic pain: Secondary | ICD-10-CM | POA: Insufficient documentation

## 2012-05-30 DIAGNOSIS — Z7982 Long term (current) use of aspirin: Secondary | ICD-10-CM | POA: Insufficient documentation

## 2012-05-30 LAB — URINE MICROSCOPIC-ADD ON

## 2012-05-30 LAB — URINALYSIS, ROUTINE W REFLEX MICROSCOPIC
Glucose, UA: NEGATIVE mg/dL
Leukocytes, UA: NEGATIVE
Protein, ur: NEGATIVE mg/dL
pH: 6 (ref 5.0–8.0)

## 2012-05-30 MED ORDER — IBUPROFEN 800 MG PO TABS
800.0000 mg | ORAL_TABLET | Freq: Once | ORAL | Status: AC
  Administered 2012-05-30: 800 mg via ORAL
  Filled 2012-05-30: qty 1

## 2012-05-30 MED ORDER — IBUPROFEN 800 MG PO TABS: 800.0000 mg | ORAL_TABLET | Freq: Three times a day (TID) | ORAL | Status: DC
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
## 2012-05-30 NOTE — ED Notes (Signed)
Pt reports pain to his upper neck and lower back for a week.  Pt denies any new or old injury.

## 2013-01-20 ENCOUNTER — Encounter (HOSPITAL_COMMUNITY): Payer: Self-pay | Admitting: Emergency Medicine

## 2013-01-20 ENCOUNTER — Emergency Department (HOSPITAL_COMMUNITY)
Admission: EM | Admit: 2013-01-20 | Discharge: 2013-01-20 | Disposition: A | Discharge status: Home / Self Care | Payer: BC Managed Care – PPO | Attending: Emergency Medicine | Admitting: Emergency Medicine

## 2013-01-20 ENCOUNTER — Emergency Department (HOSPITAL_COMMUNITY): Payer: BC Managed Care – PPO

## 2013-01-20 DIAGNOSIS — Z7982 Long term (current) use of aspirin: Secondary | ICD-10-CM | POA: Insufficient documentation

## 2013-01-20 DIAGNOSIS — Z87828 Personal history of other (healed) physical injury and trauma: Secondary | ICD-10-CM | POA: Insufficient documentation

## 2013-01-20 DIAGNOSIS — R1031 Right lower quadrant pain: Secondary | ICD-10-CM | POA: Insufficient documentation

## 2013-01-20 DIAGNOSIS — R071 Chest pain on breathing: Secondary | ICD-10-CM | POA: Insufficient documentation

## 2013-01-20 DIAGNOSIS — R0789 Other chest pain: Secondary | ICD-10-CM

## 2013-01-20 DIAGNOSIS — Z8719 Personal history of other diseases of the digestive system: Secondary | ICD-10-CM | POA: Insufficient documentation

## 2013-01-20 DIAGNOSIS — F17200 Nicotine dependence, unspecified, uncomplicated: Secondary | ICD-10-CM | POA: Insufficient documentation
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
## 2013-01-20 NOTE — ED Notes (Signed)
Pain worse with movement.

## 2013-01-20 NOTE — ED Notes (Signed)
Pt c/o pain to RUQ and LUQ but denies pain going across the upper abd x 2 weeks. Seen pcp and was dx with pulled muscle and given tramadol but states it does not help with pain. States made himself a GI appt. Pain is intermittent/sharp pains. Denies n/v/d/gu sx's. States bm's have been his normal with no changes. deneis black or bloody stools. Nad. Mm wet.

## 2013-02-08 ENCOUNTER — Encounter: Payer: Self-pay | Admitting: Gastroenterology

## 2013-02-08 ENCOUNTER — Ambulatory Visit (INDEPENDENT_AMBULATORY_CARE_PROVIDER_SITE_OTHER): Payer: BC Managed Care – PPO | Admitting: Gastroenterology

## 2013-02-08 VITALS — BP 131/89 | HR 85 | Temp 98.3°F | Wt 158.2 lb

## 2013-02-08 DIAGNOSIS — R1013 Epigastric pain: Secondary | ICD-10-CM

## 2013-02-08 MED ORDER — OMEPRAZOLE 20 MG PO CPDR: DELAYED_RELEASE_CAPSULE | ORAL | Status: DC

## 2013-02-08 NOTE — Patient Instructions (Signed)
FOLLOW A HIGH FIBER/LOW FAT DIET. SEE INFO BELOW.  START PRILOSEC. TAKE 30 MINUTES PRIOR TO MEALS TWICE DAILY.  CALL IN ONE MONTH IF YOU DON'T FEEL BETTER AND I WILL SCHEDULE FOR AN UPPER ENDOSCOPY.   CUT DOWN ON YOUR SMOKING.  NO BEER, MOONSHINE, OR LIQUOR FOR ONE MO.  FOLLOW UP IN 3 MOS.    High-Fiber Diet A high-fiber diet changes your normal diet to include more whole grains, legumes, fruits, and vegetables. Changes in the diet involve replacing refined carbohydrates with unrefined foods. The calorie level of the diet is essentially unchanged. The Dietary Reference Intake (recommended amount) for adult males is 38 grams per day. For adult females, it is 25 grams per day. Pregnant and lactating women should consume 28 grams of fiber per day. Fiber is the intact part of a plant that is not broken down during digestion. Functional fiber is fiber that has been isolated from the plant to provide a beneficial effect in the body. PURPOSE  Increase stool bulk.   Ease and regulate bowel movements.   Lower cholesterol.  INDICATIONS THAT YOU NEED MORE FIBER  Constipation and hemorrhoids.   Uncomplicated diverticulosis (intestine condition) and irritable bowel syndrome.   Weight management.   As a protective measure against hardening of the arteries (atherosclerosis), diabetes, and cancer.   DO NOT USE WITH:  Acute diverticulitis (intestine infection).   Partial small bowel obstructions.   Complicated diverticular disease involving bleeding, rupture (perforation), or abscess (boil, furuncle).   Presence of autonomic neuropathy (nerve damage) or gastroparesis (stomach cannot empty itself).    GUIDELINES FOR INCREASING FIBER IN THE DIET  Start adding fiber to the diet slowly. A gradual increase of about 5 more grams (2 slices of whole-wheat bread, 2 servings of most fruits or vegetables, or 1 bowl of high-fiber cereal) per day is best. Too rapid an increase in fiber may result in  constipation, flatulence, and bloating.   Drink enough water and fluids to keep your urine clear or pale yellow. Water, juice, or caffeine-free drinks are recommended. Not drinking enough fluid may cause constipation.   Eat a variety of high-fiber foods rather than one type of fiber.   Try to increase your intake of fiber through using high-fiber foods rather than fiber pills or supplements that contain small amounts of fiber.   The goal is to change the types of food eaten. Do not supplement your present diet with high-fiber foods, but replace foods in your present diet.    INCLUDE A VARIETY OF FIBER SOURCES  Replace refined and processed grains with whole grains, canned fruits with fresh fruits, and incorporate other fiber sources. White rice, white breads, and most bakery goods contain little or no fiber.   Brown whole-grain rice, buckwheat oats, and many fruits and vegetables are all good sources of fiber. These include: broccoli, Brussels sprouts, cabbage, cauliflower, beets, sweet potatoes, white potatoes (skin on), carrots, tomatoes, eggplant, squash, berries, fresh fruits, and dried fruits.   Cereals appear to be the richest source of fiber. Cereal fiber is found in whole grains and bran. Bran is the fiber-rich outer coat of cereal grain, which is largely removed in refining. In whole-grain cereals, the bran remains. In breakfast cereals, the largest amount of fiber is found in those with "bran" in their names. The fiber content is sometimes indicated on the label.   You may need to include additional fruits and vegetables each day.   In baking, for 1 cup white flour,  you may use the following substitutions:   1 cup whole-wheat flour minus 2 tablespoons.   1/2 cup white flour plus 1/2 cup whole-wheat flour.   Low-Fat Diet BREADS, CEREALS, PASTA, RICE, DRIED PEAS, AND BEANS These products are high in carbohydrates and most are low in fat. Therefore, they can be increased in the  diet as substitutes for fatty foods. They too, however, contain calories and should not be eaten in excess. Cereals can be eaten for snacks as well as for breakfast.   FRUITS AND VEGETABLES It is good to eat fruits and vegetables. Besides being sources of fiber, both are rich in vitamins and some minerals. They help you get the daily allowances of these nutrients. Fruits and vegetables can be used for snacks and desserts.  MEATS Limit lean meat, chicken, [place], and fish to no more than 6 ounces per day. Beef, Pork, and Lamb Use lean cuts of beef, pork, and lamb. Lean cuts include:  Extra-lean ground beef.  Arm roast.  Sirloin tip.  Center-cut ham.  Round steak.  Loin chops.  Rump roast.  Tenderloin.  Trim all fat off the outside of meats before cooking. It is not necessary to severely decrease the intake of red meat, but lean choices should be made. Lean meat is rich in protein and contains a highly absorbable form of iron. Premenopausal women, in particular, should avoid reducing lean red meat because this could increase the risk for low red blood cells (iron-deficiency anemia).  Chicken and [place] These are good sources of protein. The fat of poultry can be reduced by removing the skin and underlying fat layers before cooking. Chicken and [place] can be substituted for lean red meat in the diet. Poultry should not be fried or covered with high-fat sauces. Fish and Shellfish Fish is a good source of protein. Shellfish contain cholesterol, but they usually are low in saturated fatty acids. The preparation of fish is important. Like chicken and [place], they should not be fried or covered with high-fat sauces. EGGS Egg whites contain no fat or cholesterol. They can be eaten often. Try 1 to 2 egg whites instead of whole eggs in recipes or use egg substitutes that do not contain yolk. MILK AND DAIRY PRODUCTS Use skim or 1% milk instead of 2% or whole milk. Decrease whole milk, natural, and  processed cheeses. Use nonfat or low-fat (2%) cottage cheese or low-fat cheeses made from vegetable oils. Choose nonfat or low-fat (1 to 2%) yogurt. Experiment with evaporated skim milk in recipes that call for heavy cream. Substitute low-fat yogurt or low-fat cottage cheese for sour cream in dips and salad dressings. Have at least 2 servings of low-fat dairy products, such as 2 glasses of skim (or 1%) milk each day to help get your daily calcium intake. FATS AND OILS Reduce the total intake of fats, especially saturated fat. Butterfat, lard, and beef fats are high in saturated fat and cholesterol. These should be avoided as much as possible. Vegetable fats do not contain cholesterol, but certain vegetable fats, such as coconut oil, palm oil, and palm kernel oil are very high in saturated fats. These should be limited. These fats are often used in bakery goods, processed foods, popcorn, oils, and nondairy creamers. Vegetable shortenings and some peanut butters contain hydrogenated oils, which are also saturated fats. Read the labels on these foods and check for saturated vegetable oils. Unsaturated vegetable oils and fats do not raise blood cholesterol. However, they should be limited because they  are fats and are high in calories. Total fat should still be limited to 30% of your daily caloric intake. Desirable liquid vegetable oils are corn oil, cottonseed oil, olive oil, canola oil, safflower oil, soybean oil, and sunflower oil. Peanut oil is not as good, but small amounts are acceptable. Buy a heart-healthy tub margarine that has no partially hydrogenated oils in the ingredients. Mayonnaise and salad dressings often are made from unsaturated fats, but they should also be limited because of their high calorie and fat content. Seeds, nuts, peanut butter, olives, and avocados are high in fat, but the fat is mainly the unsaturated type. These foods should be limited mainly to avoid excess calories and fat. OTHER  EATING TIPS Snacks  Most sweets should be limited as snacks. They tend to be rich in calories and fats, and their caloric content outweighs their nutritional value. Some good choices in snacks are graham crackers, melba toast, soda crackers, bagels (no egg), English muffins, fruits, and vegetables. These snacks are preferable to snack crackers, [place] fries, TORTILLA CHIPS, and POTATO chips. Popcorn should be air-popped or cooked in small amounts of liquid vegetable oil. Desserts Eat fruit, low-fat yogurt, and fruit ices instead of pastries, cake, and cookies. Sherbet, angel food cake, gelatin dessert, frozen low-fat yogurt, or other frozen products that do not contain saturated fat (pure fruit juice bars, frozen ice pops) are also acceptable.  COOKING METHODS Choose those methods that use little or no fat. They include: Poaching.  Braising.  Steaming.  Grilling.  Baking.  Stir-frying.  Broiling.  Microwaving.  Foods can be cooked in a nonstick pan without added fat, or use a nonfat cooking spray in regular cookware. Limit fried foods and avoid frying in saturated fat. Add moisture to lean meats by using water, broth, cooking wines, and other nonfat or low-fat sauces along with the cooking methods mentioned above. Soups and stews should be chilled after cooking. The fat that forms on top after a few hours in the refrigerator should be skimmed off. When preparing meals, avoid using excess salt. Salt can contribute to raising blood pressure in some people.  EATING AWAY FROM HOME Order entres, potatoes, and vegetables without sauces or butter. When meat exceeds the size of a deck of cards (3 to 4 ounces), the rest can be taken home for another meal. Choose vegetable or fruit salads and ask for low-calorie salad dressings to be served on the side. Use dressings sparingly. Limit high-fat toppings, such as bacon, crumbled eggs, cheese, sunflower seeds, and olives. Ask for heart-healthy tub margarine  instead of butter.
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
## 2013-02-08 NOTE — Assessment & Plan Note (Signed)
PMHx: H PYLORI GASTRITIS. SX MOST LIKELY DUE TO ETOH GASTRITIS, GERD, AND LESS LIKELY PANCREATITIS.  FOLLOW A HIGH FIBER/LOW FAT DIET.  START PRILOSEC. TAKE 30 MINUTES PRIOR TO MEALS TWICE DAILY. DISCUSSED MANAGEMENT OPTIONS: 1. BID PPI EGD NOW, OR 2. BID PPI, AND EGD AFTER ONE MO IF SX NOT IMPROVED. PT ELECTED TO CALL IN ONE MONTH IF SX NOT IMPROVED, AND WILL PROCEED WITH EGD. CUT DOWN ON SMOKING. NO ETOH FOR ONE MO. FOLLOW UP IN 3 MOS.

## 2013-02-09 NOTE — Progress Notes (Signed)
cc'd to pcp 

## 2013-02-14 NOTE — Progress Notes (Signed)
Reminder in epic °

## 2013-03-16 ENCOUNTER — Emergency Department (HOSPITAL_COMMUNITY)
Admission: EM | Admit: 2013-03-16 | Discharge: 2013-03-16 | Disposition: A | Discharge status: Home / Self Care | Payer: BC Managed Care – PPO | Attending: Emergency Medicine | Admitting: Emergency Medicine

## 2013-03-16 ENCOUNTER — Encounter (HOSPITAL_COMMUNITY): Payer: Self-pay | Admitting: Emergency Medicine

## 2013-03-16 DIAGNOSIS — Z87828 Personal history of other (healed) physical injury and trauma: Secondary | ICD-10-CM | POA: Insufficient documentation

## 2013-03-16 DIAGNOSIS — F17200 Nicotine dependence, unspecified, uncomplicated: Secondary | ICD-10-CM | POA: Insufficient documentation

## 2013-03-16 DIAGNOSIS — Z79899 Other long term (current) drug therapy: Secondary | ICD-10-CM | POA: Insufficient documentation

## 2013-03-16 DIAGNOSIS — S0121XA Laceration without foreign body of nose, initial encounter: Secondary | ICD-10-CM

## 2013-03-16 DIAGNOSIS — S0120XA Unspecified open wound of nose, initial encounter: Secondary | ICD-10-CM | POA: Insufficient documentation

## 2013-03-16 DIAGNOSIS — K219 Gastro-esophageal reflux disease without esophagitis: Secondary | ICD-10-CM | POA: Insufficient documentation

## 2013-03-16 DIAGNOSIS — Z8619 Personal history of other infectious and parasitic diseases: Secondary | ICD-10-CM | POA: Insufficient documentation

## 2013-03-16 DIAGNOSIS — Z23 Encounter for immunization: Secondary | ICD-10-CM | POA: Insufficient documentation

## 2013-03-16 MED ORDER — TETANUS-DIPHTH-ACELL PERTUSSIS 5-2.5-18.5 LF-MCG/0.5 IM SUSP
0.5000 mL | Freq: Once | INTRAMUSCULAR | Status: AC
  Administered 2013-03-16: 0.5 mL via INTRAMUSCULAR
  Filled 2013-03-16: qty 0.5

## 2013-03-16 MED ORDER — LIDOCAINE HCL (PF) 1 % IJ SOLN
INTRAMUSCULAR | Status: AC
  Administered 2013-03-16: 21:00:00
  Filled 2013-03-16: qty 5

## 2013-03-16 MED ORDER — BACITRACIN ZINC 500 UNIT/GM EX OINT
TOPICAL_OINTMENT | CUTANEOUS | Status: AC
  Administered 2013-03-16: 1
  Filled 2013-03-16: qty 0.9

## 2013-03-16 NOTE — ED Notes (Addendum)
Patient has several family/visitors with him, but while in triage did not want his mother in room with him - states "I don't want to make her mad" but did not need her with him.  Cooperative.  When asked how much he has had to drink- said I will be honest- it's Thanksgiving so Iv'e been drinking some - beer and liquor.
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
## 2013-03-22 NOTE — ED Provider Notes (Signed)
Medical screening examination/treatment/procedure(s) were performed by non-physician practitioner and as supervising physician I was immediately available for consultation/collaboration.  EKG Interpretation   None       Devoria Albe, MD, Armando Gang   Ward Givens, MD 03/22/13 [phone]

## 2013-05-29 ENCOUNTER — Other Ambulatory Visit (HOSPITAL_COMMUNITY): Payer: Self-pay | Admitting: Internal Medicine

## 2013-05-29 ENCOUNTER — Ambulatory Visit (HOSPITAL_COMMUNITY)
Admission: RE | Admit: 2013-05-29 | Discharge: 2013-05-29 | Disposition: A | Discharge status: Home / Self Care | Payer: BC Managed Care – PPO | Source: Ambulatory Visit | Attending: Internal Medicine | Admitting: Internal Medicine

## 2013-05-29 DIAGNOSIS — R0789 Other chest pain: Secondary | ICD-10-CM

## 2013-05-29 DIAGNOSIS — R079 Chest pain, unspecified: Secondary | ICD-10-CM | POA: Insufficient documentation

## 2013-11-27 ENCOUNTER — Emergency Department (HOSPITAL_COMMUNITY)
Admission: EM | Admit: 2013-11-27 | Discharge: 2013-11-27 | Disposition: A | Discharge status: Home / Self Care | Payer: BC Managed Care – PPO | Attending: Emergency Medicine | Admitting: Emergency Medicine

## 2013-11-27 ENCOUNTER — Encounter (HOSPITAL_COMMUNITY): Payer: Self-pay | Admitting: Emergency Medicine

## 2013-11-27 ENCOUNTER — Emergency Department (HOSPITAL_COMMUNITY): Payer: BC Managed Care – PPO

## 2013-11-27 DIAGNOSIS — F17200 Nicotine dependence, unspecified, uncomplicated: Secondary | ICD-10-CM | POA: Insufficient documentation

## 2013-11-27 DIAGNOSIS — Z8619 Personal history of other infectious and parasitic diseases: Secondary | ICD-10-CM | POA: Insufficient documentation

## 2013-11-27 DIAGNOSIS — K219 Gastro-esophageal reflux disease without esophagitis: Secondary | ICD-10-CM | POA: Insufficient documentation

## 2013-11-27 DIAGNOSIS — R079 Chest pain, unspecified: Secondary | ICD-10-CM | POA: Insufficient documentation

## 2013-11-27 DIAGNOSIS — Z79899 Other long term (current) drug therapy: Secondary | ICD-10-CM | POA: Insufficient documentation

## 2013-11-27 DIAGNOSIS — Z87828 Personal history of other (healed) physical injury and trauma: Secondary | ICD-10-CM | POA: Insufficient documentation

## 2013-11-27 LAB — CBC WITH DIFFERENTIAL/PLATELET
BASOS ABS: 0 10*3/uL (ref 0.0–0.1)
BASOS PCT: 0 % (ref 0–1)
EOS ABS: 0.3 10*3/uL (ref 0.0–0.7)
EOS PCT: 4 % (ref 0–5)
HEMATOCRIT: 47.8 % (ref 39.0–52.0)
Hemoglobin: 16.1 g/dL (ref 13.0–17.0)
Lymphocytes Relative: 45 % (ref 12–46)
Lymphs Abs: 2.8 10*3/uL (ref 0.7–4.0)
MCH: 26.2 pg (ref 26.0–34.0)
MCHC: 33.7 g/dL (ref 30.0–36.0)
MCV: 77.7 fL — AB (ref 78.0–100.0)
MONO ABS: 0.5 10*3/uL (ref 0.1–1.0)
Monocytes Relative: 9 % (ref 3–12)
Neutro Abs: 2.6 10*3/uL (ref 1.7–7.7)
Neutrophils Relative %: 42 % — ABNORMAL LOW (ref 43–77)
Platelets: 191 10*3/uL (ref 150–400)
RBC: 6.15 MIL/uL — ABNORMAL HIGH (ref 4.22–5.81)
RDW: 14.5 % (ref 11.5–15.5)
WBC: 6.2 10*3/uL (ref 4.0–10.5)

## 2013-11-27 LAB — COMPREHENSIVE METABOLIC PANEL
ALBUMIN: 4 g/dL (ref 3.5–5.2)
ALT: 27 U/L (ref 0–53)
AST: 23 U/L (ref 0–37)
Alkaline Phosphatase: 53 U/L (ref 39–117)
Anion gap: 12 (ref 5–15)
BUN: 10 mg/dL (ref 6–23)
CALCIUM: 9.4 mg/dL (ref 8.4–10.5)
CO2: 26 mEq/L (ref 19–32)
CREATININE: 1.18 mg/dL (ref 0.50–1.35)
Chloride: 103 mEq/L (ref 96–112)
GFR calc Af Amer: >90 mL/min (ref >90)
GFR, EST NON AFRICAN AMERICAN: 78 mL/min — AB (ref >90)
Glucose, Bld: 92 mg/dL (ref 70–99)
Potassium: 4.1 mEq/L (ref 3.7–5.3)
Sodium: 141 mEq/L (ref 137–147)
TOTAL PROTEIN: 7.3 g/dL (ref 6.0–8.3)
Total Bilirubin: 0.2 mg/dL — ABNORMAL LOW (ref 0.3–1.2)

## 2013-11-27 LAB — TROPONIN I

## 2013-11-27 MED ORDER — NAPROXEN 500 MG PO TABS: 500.0000 mg | ORAL_TABLET | Freq: Two times a day (BID) | ORAL | Status: DC

## 2013-11-27 NOTE — ED Notes (Signed)
Pt reports a soreness to the left breast/ axillary area. Pt denies any injury or no activity.
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
## 2013-11-27 NOTE — Discharge Instructions (Signed)

## 2014-04-09 ENCOUNTER — Other Ambulatory Visit: Payer: Self-pay | Admitting: Gastroenterology

## 2014-05-15 ENCOUNTER — Other Ambulatory Visit (HOSPITAL_COMMUNITY): Payer: Self-pay | Admitting: Pulmonary Disease

## 2014-05-15 DIAGNOSIS — R079 Chest pain, unspecified: Secondary | ICD-10-CM

## 2014-05-17 ENCOUNTER — Ambulatory Visit (HOSPITAL_COMMUNITY): Payer: BC Managed Care – PPO

## 2014-06-04 ENCOUNTER — Encounter (HOSPITAL_COMMUNITY): Payer: Self-pay

## 2014-06-04 ENCOUNTER — Ambulatory Visit (HOSPITAL_COMMUNITY)
Admission: RE | Admit: 2014-06-04 | Discharge: 2014-06-04 | Disposition: A | Discharge status: Home / Self Care | Payer: BC Managed Care – PPO | Source: Ambulatory Visit | Attending: Pulmonary Disease | Admitting: Pulmonary Disease

## 2014-06-04 DIAGNOSIS — R079 Chest pain, unspecified: Secondary | ICD-10-CM | POA: Diagnosis present

## 2014-06-04 DIAGNOSIS — Z87891 Personal history of nicotine dependence: Secondary | ICD-10-CM | POA: Diagnosis not present

## 2014-06-04 MED ORDER — IOHEXOL 300 MG/ML  SOLN
80.0000 mL | Freq: Once | INTRAMUSCULAR | Status: AC | PRN
  Administered 2014-06-04: 80 mL via INTRAVENOUS

## 2014-06-04 MED ORDER — SODIUM CHLORIDE 0.9 % IJ SOLN
INTRAMUSCULAR | Status: AC
  Filled 2014-06-04: qty 500

## 2014-07-26 ENCOUNTER — Other Ambulatory Visit (HOSPITAL_COMMUNITY): Payer: Self-pay | Admitting: Pulmonary Disease

## 2014-07-26 DIAGNOSIS — E049 Nontoxic goiter, unspecified: Secondary | ICD-10-CM

## 2014-07-31 ENCOUNTER — Ambulatory Visit (HOSPITAL_COMMUNITY)
Admission: RE | Admit: 2014-07-31 | Discharge: 2014-07-31 | Disposition: A | Discharge status: Home / Self Care | Payer: BC Managed Care – PPO | Source: Ambulatory Visit | Attending: Pulmonary Disease | Admitting: Pulmonary Disease

## 2014-07-31 DIAGNOSIS — E049 Nontoxic goiter, unspecified: Secondary | ICD-10-CM | POA: Insufficient documentation

## 2014-08-03 ENCOUNTER — Other Ambulatory Visit (HOSPITAL_COMMUNITY): Payer: Self-pay | Admitting: Internal Medicine

## 2014-08-03 DIAGNOSIS — E041 Nontoxic single thyroid nodule: Secondary | ICD-10-CM

## 2014-08-14 ENCOUNTER — Other Ambulatory Visit (HOSPITAL_COMMUNITY): Payer: Self-pay | Admitting: Internal Medicine

## 2014-08-14 DIAGNOSIS — E041 Nontoxic single thyroid nodule: Secondary | ICD-10-CM

## 2014-08-15 ENCOUNTER — Ambulatory Visit (HOSPITAL_COMMUNITY)
Admission: RE | Admit: 2014-08-15 | Discharge: 2014-08-15 | Disposition: A | Discharge status: Home / Self Care | Payer: BC Managed Care – PPO | Source: Ambulatory Visit | Attending: Internal Medicine | Admitting: Internal Medicine

## 2014-08-15 DIAGNOSIS — E041 Nontoxic single thyroid nodule: Secondary | ICD-10-CM | POA: Insufficient documentation

## 2014-08-15 MED ORDER — LIDOCAINE HCL (PF) 2 % IJ SOLN
INTRAMUSCULAR | Status: AC
  Administered 2014-08-15: 10 mL
  Filled 2014-08-15: qty 10

## 2014-08-15 NOTE — Discharge Instructions (Signed)
Thyroid Biopsy °The thyroid gland is a butterfly-shaped gland situated in the front of the neck. It produces hormones which affect metabolism, growth and development, and body temperature. A thyroid biopsy is a procedure in which small samples of tissue or fluid are removed from the thyroid gland or mass and examined under a microscope. This test is done to determine the cause of thyroid problems, such as infection, cancer, or other thyroid problems. °There are 2 ways to obtain samples: °1. Fine needle biopsy. Samples are removed using a thin needle inserted through the skin and into the thyroid gland or mass. °2. Open biopsy. Samples are removed after a cut (incision) is made through the skin. °LET YOUR CAREGIVER KNOW ABOUT:  °· Allergies. °· Medications taken including herbs, eye drops, over-the-counter medications, and creams. °· Use of steroids (by mouth or creams). °· Previous problems with anesthetics or numbing medicine. °· Possibility of pregnancy, if this applies. °· History of blood clots (thrombophlebitis). °· History of bleeding or blood problems. °· Previous surgery. °· Other health problems. °RISKS AND COMPLICATIONS °· Bleeding from the site. The risk of bleeding is higher if you have a bleeding disorder or are taking any blood thinning medications (anticoagulants). °· Infection. °· Injury to structures near the thyroid gland. °BEFORE THE PROCEDURE  °This is a procedure that can be done as an outpatient. Confirm the time that you need to arrive for your procedure. Confirm whether there is a need to fast or withhold any medications. A blood sample may be done to determine your blood clotting time. Medicine may be given to help you relax (sedative). °PROCEDURE °Fine needle biopsy. °You will be awake during the procedure. You may be asked to lie on your back with your head tipped backward to extend your neck. Let your caregiver know if you cannot tolerate the positioning. An area on your neck will be  cleansed. A needle is inserted through the skin of your neck. You may feel a mild discomfort during this procedure. You may be asked to avoid coughing, talking, swallowing, or making sounds during some portions of the procedure. The needle is withdrawn once tissue or fluid samples have been removed. Pressure may be applied to the neck to reduce swelling and ensure that bleeding has stopped. The samples will be sent for examination.  °Open biopsy. °You will be given general anesthesia. You will be asleep during the procedure. An incision is made in your neck. A sample of thyroid tissue or the mass is removed. The tissue sample or mass will be sent for examination. The sample or mass may be examined during the biopsy. If the sample or mass contains cancer cells, some or all of the thyroid gland may be removed. The incision is closed with stitches. °AFTER THE PROCEDURE  °Your recovery will be assessed and monitored. If there are no problems, as an outpatient, you should be able to go home shortly after the procedure. °If you had a fine needle biopsy: °· You may have soreness at the biopsy site for 1 to 2 days. °If you had an open biopsy:  °· You may have soreness at the biopsy site for 3 to 4 days. °· You may have a hoarse voice or sore throat for 1 to 2 days. °Obtaining the Test Results °It is your responsibility to obtain your test results. Do not assume everything is normal if you have not heard from your caregiver or the medical facility. It is important for you to follow up   on all of your test results. °HOME CARE INSTRUCTIONS  °· Keeping your head raised on a pillow when you are lying down may ease biopsy site discomfort. °· Supporting the back of your head and neck with both hands as you sit up from a lying position may ease biopsy site discomfort. °· Only take over-the-counter or prescription medicines for pain, discomfort, or fever as directed by your caregiver. °· Throat lozenges or gargling with warm salt  water may help to soothe a sore throat. °SEEK IMMEDIATE MEDICAL CARE IF:  °· You have severe bleeding from the biopsy site. °· You have difficulty swallowing. °· You have a fever. °· You have increased pain, swelling, redness, or warmth at the biopsy site. °· You notice pus coming from the biopsy site. °· You have swollen glands (lymph nodes) in your neck. °Document Released: 02/01/2007 Document Revised: 08/01/2012 Document Reviewed: 06/29/2013 °[employer]® Patient Information ©2015 [employer]. This information is not intended to replace advice given to you by your health care provider. Make sure you discuss any questions you have with your health care provider. ° °

## 2015-12-14 IMAGING — US US THYROID BIOPSY
1 series · 14 of 14 positions shown · non-contrast
Comparison: None.

CLINICAL DATA: Thyroid nodules

EXAM:
ULTRASOUND GUIDED NEEDLE ASPIRATE BIOPSY OF THE THYROID GLAND

[Series 1: us thyroid biopsy · 0.04mm/px · 14 acquisitions, 14 frames shown]
[im 1/14]
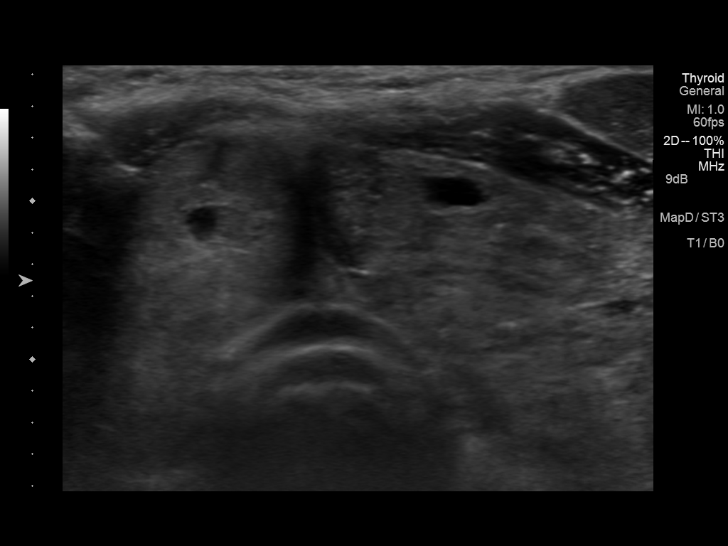
[im 2/14]
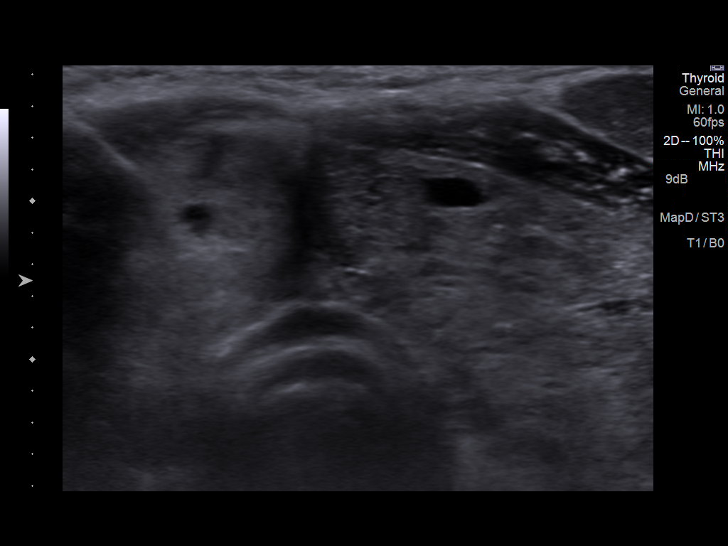
[im 3/14]
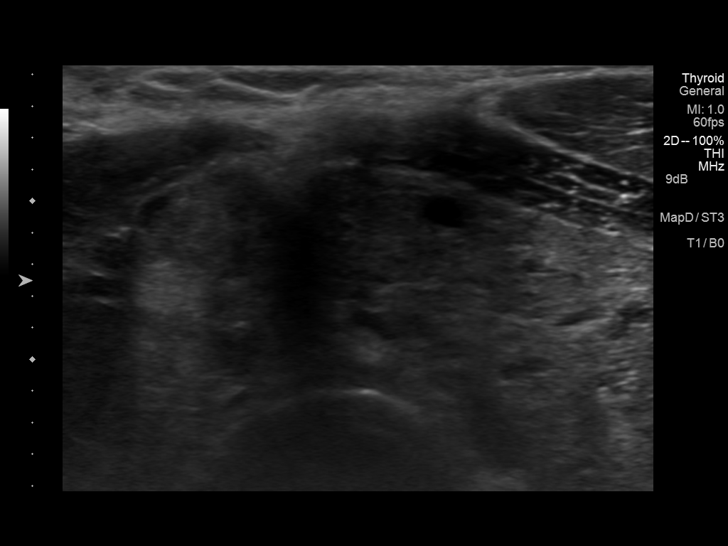
[im 4/14]
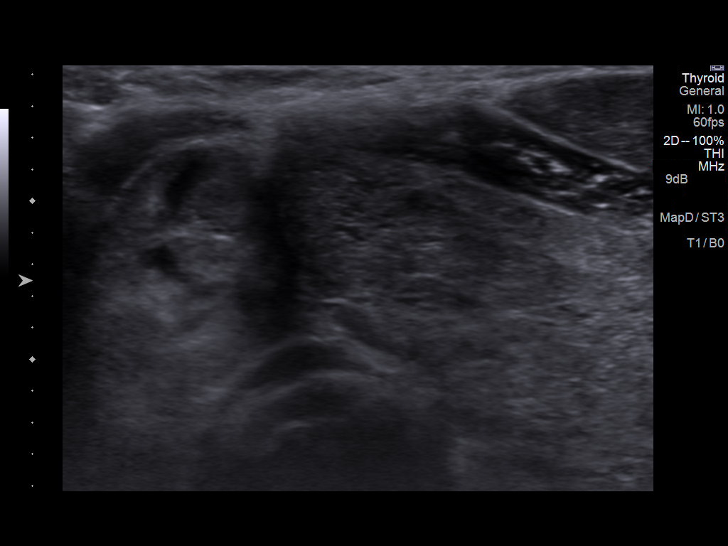
[im 5/14]
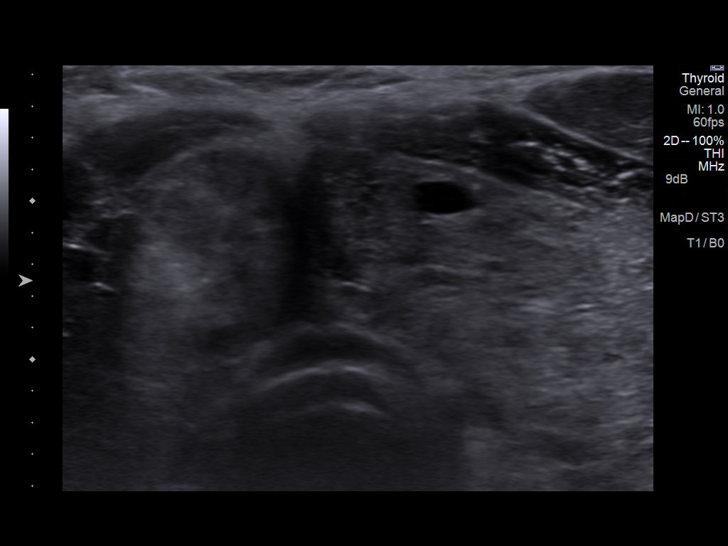
[im 6/14]
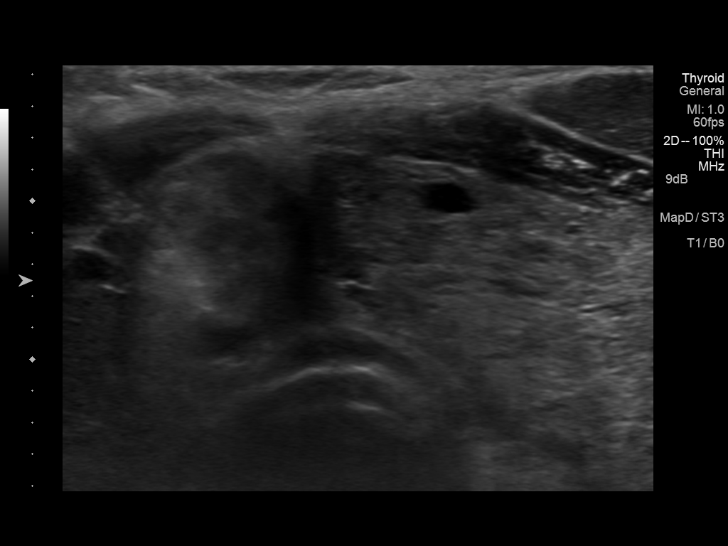
[im 7/14]
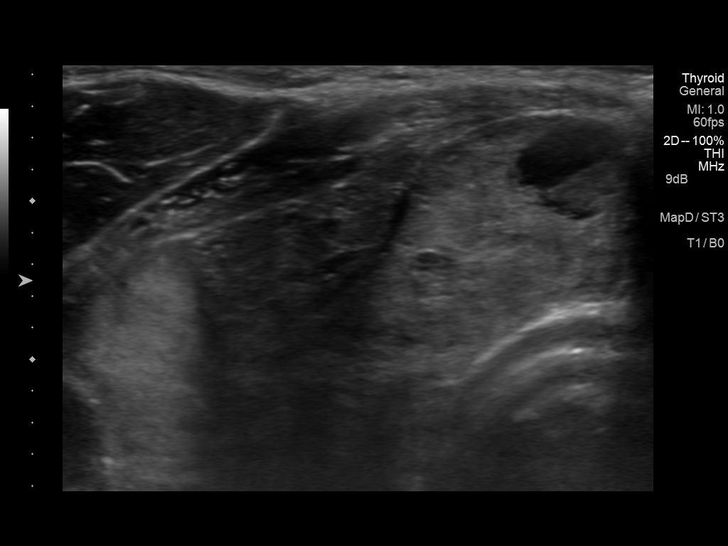
[im 8/14]
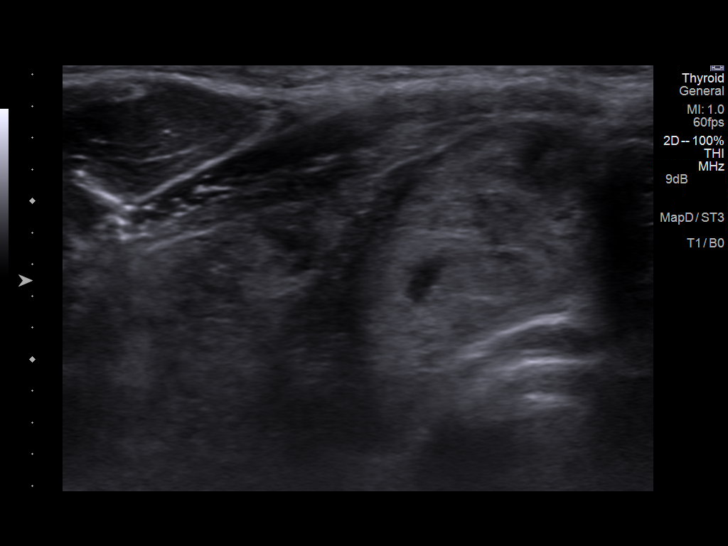
[im 9/14]
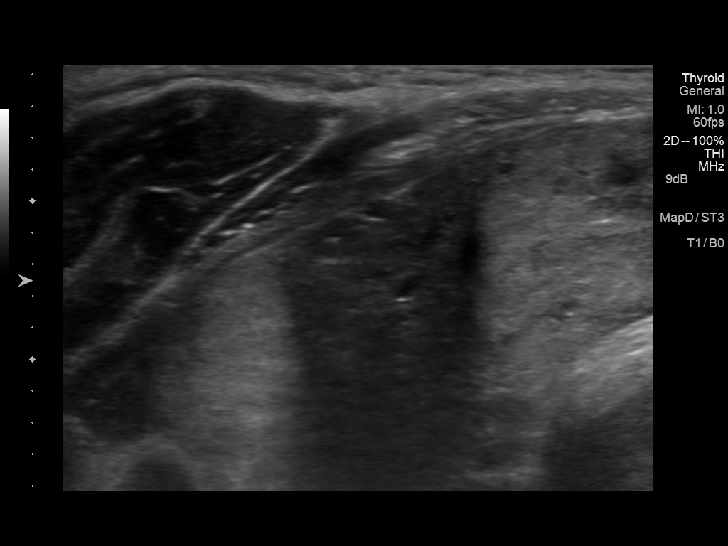
[im 10/14]
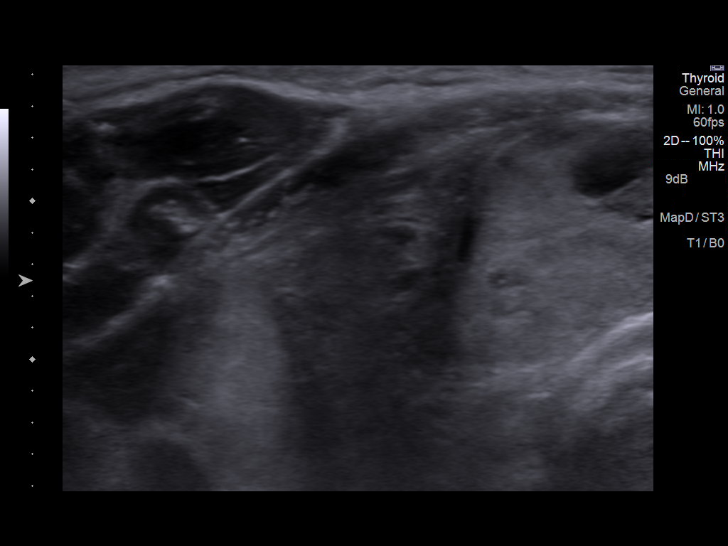
[im 11/14]
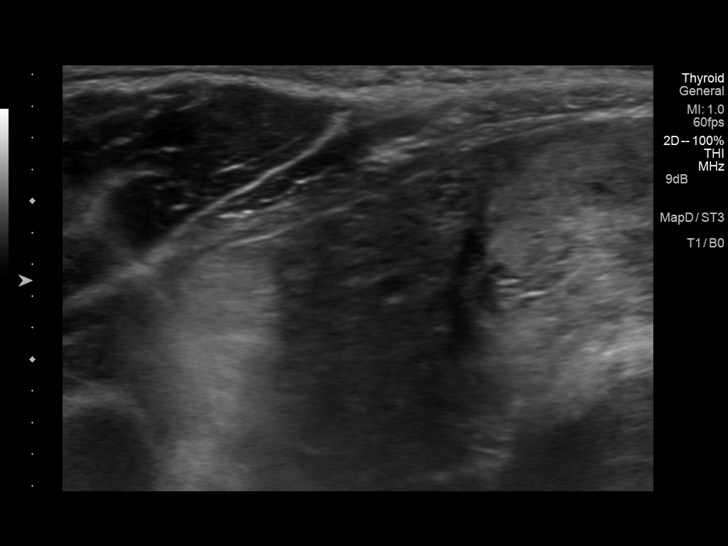
[im 12/14]
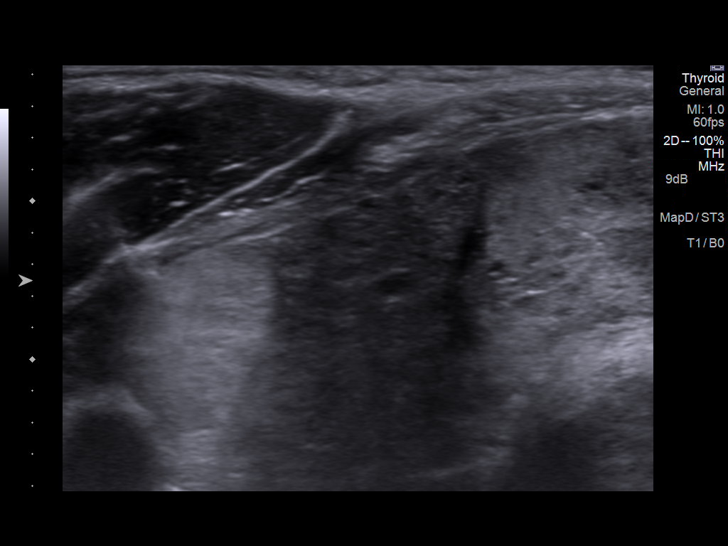
[im 13/14]
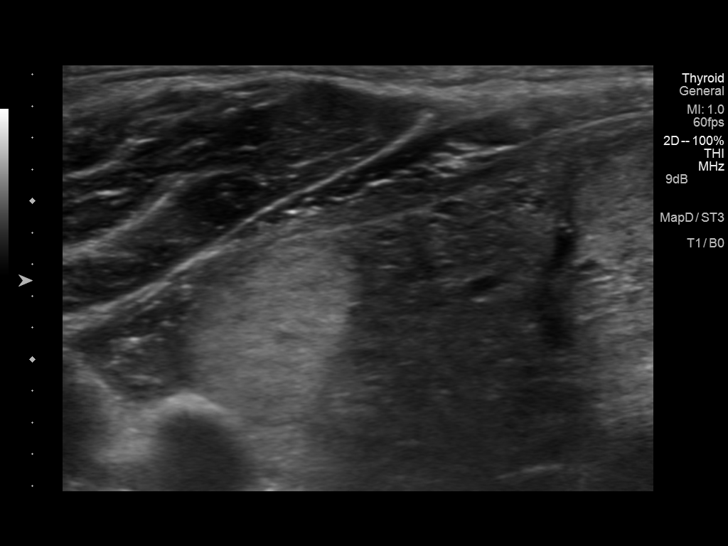
[im 14/14]
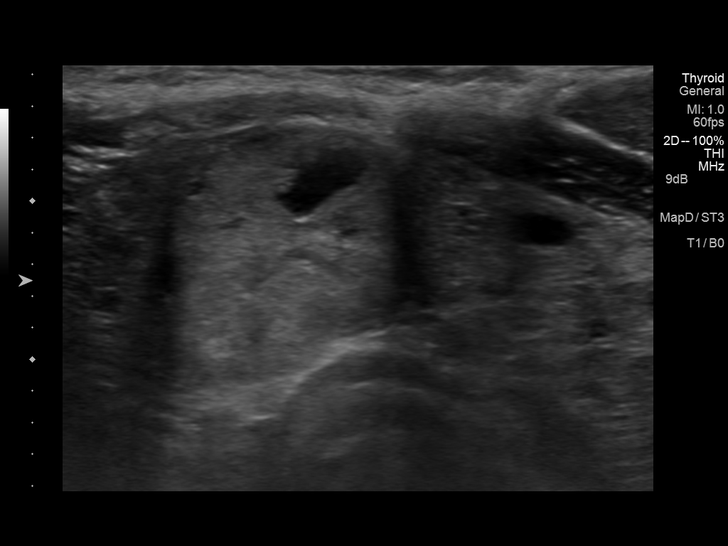

[14 of 14 positions shown; findings below may reference images not displayed]

PROCEDURE:
Thyroid biopsy was thoroughly discussed with the patient and
questions were answered. The benefits, risks, alternatives, and
complications were also discussed. The patient understands and
wishes to proceed with the procedure. Written consent was obtained.

Ultrasound was performed to localize and mark an adequate site for
the biopsy. The patient was then prepped and draped in a normal
sterile fashion. Local anesthesia was provided with 1% lidocaine.
Using direct ultrasound guidance, 3 passes were made using needles
into the nodule within the right lobe of the thyroid. Ultrasound was
used to confirm needle placements on all occasions. Specimens were
sent to Pathology for analysis.

Complications:  None
FINDINGS: As above
IMPRESSION: Ultrasound guided needle aspirate biopsy performed of the right
thyroid nodule.

## 2015-12-16 ENCOUNTER — Other Ambulatory Visit (HOSPITAL_COMMUNITY): Payer: Self-pay | Admitting: Internal Medicine

## 2015-12-16 ENCOUNTER — Ambulatory Visit (HOSPITAL_COMMUNITY)
Admission: RE | Admit: 2015-12-16 | Discharge: 2015-12-16 | Disposition: A | Discharge status: Home / Self Care | Payer: BC Managed Care – PPO | Source: Ambulatory Visit | Attending: Internal Medicine | Admitting: Internal Medicine

## 2015-12-16 DIAGNOSIS — M25511 Pain in right shoulder: Secondary | ICD-10-CM | POA: Insufficient documentation

## 2015-12-16 DIAGNOSIS — R52 Pain, unspecified: Secondary | ICD-10-CM

## 2015-12-30 ENCOUNTER — Ambulatory Visit: Payer: BC Managed Care – PPO | Admitting: Orthopedic Surgery

## 2016-01-06 ENCOUNTER — Ambulatory Visit (INDEPENDENT_AMBULATORY_CARE_PROVIDER_SITE_OTHER): Payer: BC Managed Care – PPO | Admitting: Orthopedic Surgery

## 2016-01-06 ENCOUNTER — Encounter: Payer: Self-pay | Admitting: Orthopedic Surgery

## 2016-01-06 VITALS — BP 114/98 | Ht 67.0 in | Wt 163.0 lb

## 2016-01-06 DIAGNOSIS — M7711 Lateral epicondylitis, right elbow: Secondary | ICD-10-CM | POA: Diagnosis not present

## 2016-01-06 MED ORDER — PREDNISONE 10 MG PO TABS: 10.0000 mg | ORAL_TABLET | Freq: Every day | ORAL | 0 refills | Status: DC
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
## 2016-01-06 NOTE — Patient Instructions (Addendum)
You have received an injection of steroids into the joint. 15% of patients will have increased pain within the 24 hours postinjection.   This is transient and will go away.   We recommend that you use ice packs on the injection site for 20 minutes every 2 hours and extra strength Tylenol 2 tablets every 8 as needed until the pain resolves.  If you continue to have pain after taking the Tylenol and using the ice please call the office for further instructions.    Tennis Elbow Tennis elbow (lateral epicondylitis) is inflammation of the outer tendons of your forearm close to your elbow. Your tendons attach your muscles to your bones. The outer tendons of your forearm are used to extend your wrist, and they attach on the outside part of your elbow. Tennis elbow is often found in people who play tennis, but anyone may get the condition from repeatedly extending the wrist or turning the forearm. CAUSES This condition is caused by repeatedly extending your wrist and using your hands. It can result from sports or work that requires repetitive forearm movements. Tennis elbow may also be caused by an injury. RISK FACTORS You have a higher risk of developing tennis elbow if you play tennis or another racquet sport. You also have a higher risk if you frequently use your hands for work. This condition is also more likely to develop in:  Musicians.  Carpenters, painters, and plumbers.  Cooks.  Cashiers.  People who work in [employer].  Holiday representative workers.  Butchers.  People who use computers. SYMPTOMS Symptoms of this condition include:  Pain and tenderness in your forearm and the outer part of your elbow. You may only feel the pain when you use your arm, or you may feel it even when you are not using your arm.  A burning feeling that runs from your elbow through your arm.  Weak grip in your hands. DIAGNOSIS  This condition may be diagnosed by medical history and physical exam. You may  also have other tests, including:  X-rays.  MRI. TREATMENT Your health care provider will recommend lifestyle adjustments, such as resting and icing your arm. Treatment may also include:  Medicines for inflammation. This may include shots of cortisone if your pain continues.  Physical therapy. This may include massage or exercises.  An elbow brace. Surgery may eventually be recommended if your pain does not go away with treatment. HOME CARE INSTRUCTIONS Activity  Rest your elbow and wrist as directed by your health care provider. Try to avoid any activities that caused the problem until your health care provider says that you can do them again.  If a physical therapist teaches you exercises, do all of them as directed.  If you lift an object, lift it with your palm facing upward. This lowers the stress on your elbow. Lifestyle  If your tennis elbow is caused by sports, check your equipment and make sure that:  You are using it correctly.  It is the best fit for you.  If your tennis elbow is caused by work, take breaks frequently, if you are able. Talk with your manager about how to best perform tasks in a way that is safe.  If your tennis elbow is caused by computer use, talk with your manager about any changes that can be made to your work environment. General Instructions  If directed, apply ice to the painful area:  Put ice in a plastic bag.  Place a towel between your skin  and the bag.  Leave the ice on for 20 minutes, 2-3 times per day.  Take medicines only as directed by your health care provider.  If you were given a brace, wear it as directed by your health care provider.  Keep all follow-up visits as directed by your health care provider. This is important. SEEK MEDICAL CARE IF:  Your pain does not get better with treatment.  Your pain gets worse.  You have numbness or weakness in your forearm, hand, or fingers.   This information is not intended to  replace advice given to you by your health care provider. Make sure you discuss any questions you have with your health care provider.   Document Released: 04/06/2005 Document Revised: 08/21/2014 Document Reviewed: 04/02/2014 Elsevier Interactive Patient Education [employer]2016 [employer] Inc.

## 2016-06-17 ENCOUNTER — Ambulatory Visit (HOSPITAL_COMMUNITY)
Admission: RE | Admit: 2016-06-17 | Discharge: 2016-06-17 | Disposition: A | Discharge status: Home / Self Care | Payer: BC Managed Care – PPO | Source: Ambulatory Visit | Attending: Pulmonary Disease | Admitting: Pulmonary Disease

## 2016-06-17 ENCOUNTER — Other Ambulatory Visit (HOSPITAL_COMMUNITY): Payer: Self-pay | Admitting: Pulmonary Disease

## 2016-06-17 DIAGNOSIS — R079 Chest pain, unspecified: Secondary | ICD-10-CM | POA: Insufficient documentation

## 2017-01-13 ENCOUNTER — Emergency Department (HOSPITAL_COMMUNITY): Payer: BC Managed Care – PPO

## 2017-01-13 ENCOUNTER — Emergency Department (HOSPITAL_COMMUNITY)
Admission: EM | Admit: 2017-01-13 | Discharge: 2017-01-13 | Disposition: A | Discharge status: Home / Self Care | Payer: BC Managed Care – PPO | Attending: Emergency Medicine | Admitting: Emergency Medicine

## 2017-01-13 ENCOUNTER — Encounter (HOSPITAL_COMMUNITY): Payer: Self-pay | Admitting: Emergency Medicine

## 2017-01-13 DIAGNOSIS — M25512 Pain in left shoulder: Secondary | ICD-10-CM | POA: Diagnosis not present

## 2017-01-13 DIAGNOSIS — Z79899 Other long term (current) drug therapy: Secondary | ICD-10-CM | POA: Insufficient documentation

## 2017-01-13 DIAGNOSIS — F17210 Nicotine dependence, cigarettes, uncomplicated: Secondary | ICD-10-CM | POA: Diagnosis not present

## 2017-01-13 MED ORDER — CYCLOBENZAPRINE HCL 10 MG PO TABS: 10.0000 mg | ORAL_TABLET | Freq: Three times a day (TID) | ORAL | 0 refills | Status: DC | PRN

## 2017-01-13 MED ORDER — IBUPROFEN 600 MG PO TABS: 600.0000 mg | ORAL_TABLET | Freq: Four times a day (QID) | ORAL | 0 refills | Status: DC | PRN

## 2017-01-13 NOTE — ED Triage Notes (Signed)
Pt c/o left shoulder pain. Hx of rotator cuff surgery. Pain to anterior left shoulder since yesterday. No injury. Nad. rom wnl

## 2017-01-13 NOTE — Discharge Instructions (Signed)
Apply ice packs on and off to shoulder. Follow-up with the primary doctor for recheck if needed.  Return here for any worsening symptoms
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
## 2017-09-01 ENCOUNTER — Other Ambulatory Visit (HOSPITAL_COMMUNITY): Payer: Self-pay | Admitting: Pulmonary Disease

## 2017-09-01 ENCOUNTER — Ambulatory Visit (HOSPITAL_COMMUNITY)
Admission: RE | Admit: 2017-09-01 | Discharge: 2017-09-01 | Disposition: A | Discharge status: Home / Self Care | Payer: BC Managed Care – PPO | Source: Ambulatory Visit | Attending: Pulmonary Disease | Admitting: Pulmonary Disease

## 2017-09-01 DIAGNOSIS — R0781 Pleurodynia: Secondary | ICD-10-CM

## 2018-10-24 ENCOUNTER — Other Ambulatory Visit: Payer: Self-pay | Admitting: Internal Medicine

## 2018-10-24 ENCOUNTER — Other Ambulatory Visit: Payer: BC Managed Care – PPO

## 2018-10-24 DIAGNOSIS — Z20822 Contact with and (suspected) exposure to covid-19: Secondary | ICD-10-CM

## 2018-10-29 LAB — NOVEL CORONAVIRUS, NAA: SARS-CoV-2, NAA: NOT DETECTED

## 2018-11-03 ENCOUNTER — Telehealth: Payer: Self-pay | Admitting: Pulmonary Disease

## 2018-11-03 NOTE — Telephone Encounter (Signed)
Patient called in and received his results °

## 2019-02-21 ENCOUNTER — Other Ambulatory Visit: Payer: Self-pay | Admitting: *Deleted

## 2019-02-21 DIAGNOSIS — Z20822 Contact with and (suspected) exposure to covid-19: Secondary | ICD-10-CM

## 2019-02-23 ENCOUNTER — Telehealth: Payer: Self-pay | Admitting: Pulmonary Disease

## 2019-02-23 LAB — NOVEL CORONAVIRUS, NAA: SARS-CoV-2, NAA: NOT DETECTED

## 2019-02-23 NOTE — Telephone Encounter (Signed)
Patient called in and received his covid test result °

## 2019-06-17 ENCOUNTER — Encounter (HOSPITAL_COMMUNITY): Payer: Self-pay | Admitting: Emergency Medicine

## 2019-06-17 ENCOUNTER — Emergency Department (HOSPITAL_COMMUNITY)
Admission: EM | Admit: 2019-06-17 | Discharge: 2019-06-17 | Disposition: A | Discharge status: Home / Self Care | Payer: BC Managed Care – PPO | Attending: Emergency Medicine | Admitting: Emergency Medicine

## 2019-06-17 ENCOUNTER — Other Ambulatory Visit: Payer: Self-pay

## 2019-06-17 DIAGNOSIS — Z79899 Other long term (current) drug therapy: Secondary | ICD-10-CM | POA: Insufficient documentation

## 2019-06-17 DIAGNOSIS — K6289 Other specified diseases of anus and rectum: Secondary | ICD-10-CM | POA: Diagnosis present

## 2019-06-17 DIAGNOSIS — F17210 Nicotine dependence, cigarettes, uncomplicated: Secondary | ICD-10-CM | POA: Diagnosis not present

## 2019-06-17 DIAGNOSIS — K649 Unspecified hemorrhoids: Secondary | ICD-10-CM | POA: Diagnosis not present

## 2019-06-17 DIAGNOSIS — I10 Essential (primary) hypertension: Secondary | ICD-10-CM | POA: Insufficient documentation

## 2019-06-17 MED ORDER — HYDROCORTISONE ACETATE 25 MG RE SUPP: 25.0000 mg | Freq: Two times a day (BID) | RECTAL | 0 refills | Status: AC
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
## 2019-06-17 NOTE — ED Triage Notes (Signed)
Pt reports having rectal pain with defecation x 2 weeks. Denies bleeding. Has been using preparation h without relief.

## 2019-06-17 NOTE — Discharge Instructions (Signed)
Your exam is consistent with a small hemorrhoid, please read the attached instructions Your blood pressure is also elevated, this needs to be rechecked in the doctor's office this week See the list of phone numbers below for local family doctors Please use Anusol suppositories as prescribed, 1 suppository in the rectum every 12 hours, also make sure that you are taking a stool softener or laxative to keep your stools nice and soft, avoid straining, heavy lifting or constipation as this can make your condition worse. Return to the emergency department for severe or worsening pain, fever or excessive bleeding

## 2019-07-05 ENCOUNTER — Encounter: Payer: Self-pay | Admitting: Family Medicine

## 2019-07-05 ENCOUNTER — Other Ambulatory Visit: Payer: Self-pay

## 2019-07-05 ENCOUNTER — Ambulatory Visit: Payer: BC Managed Care – PPO | Admitting: Family Medicine

## 2019-07-05 VITALS — BP 120/78 | HR 112 | Temp 98.2°F | Resp 15 | Ht 66.0 in | Wt 160.0 lb

## 2019-07-05 DIAGNOSIS — I10 Essential (primary) hypertension: Secondary | ICD-10-CM | POA: Insufficient documentation

## 2019-07-05 DIAGNOSIS — R195 Other fecal abnormalities: Secondary | ICD-10-CM | POA: Diagnosis not present

## 2019-07-05 DIAGNOSIS — K649 Unspecified hemorrhoids: Secondary | ICD-10-CM | POA: Diagnosis not present

## 2019-07-05 DIAGNOSIS — R030 Elevated blood-pressure reading, without diagnosis of hypertension: Secondary | ICD-10-CM | POA: Diagnosis not present

## 2019-07-05 DIAGNOSIS — E663 Overweight: Secondary | ICD-10-CM | POA: Insufficient documentation

## 2019-07-05 DIAGNOSIS — F17219 Nicotine dependence, cigarettes, with unspecified nicotine-induced disorders: Secondary | ICD-10-CM

## 2019-07-05 HISTORY — DX: Other fecal abnormalities: R19.5

## 2019-07-05 MED ORDER — HYDROCORTISONE ACETATE 25 MG RE SUPP: 25.0000 mg | Freq: Two times a day (BID) | RECTAL | 0 refills | Status: AC

## 2019-07-05 MED ORDER — DOCUSATE SODIUM 100 MG PO CAPS: 100.0000 mg | ORAL_CAPSULE | Freq: Every day | ORAL | 0 refills | Status: DC

## 2019-07-05 NOTE — Assessment & Plan Note (Signed)
Mathius L Ishaq is educated about the importance of exercise daily to help with weight management. A minumum of 30 minutes daily is recommended. Additionally, importance of healthy food choices  with portion control discussed.   Wt Readings from Last 3 Encounters:  07/05/19 160 lb (72.6 kg)  06/17/19 160 lb (72.6 kg)  01/13/17 165 lb (74.8 kg)

## 2019-07-05 NOTE — Assessment & Plan Note (Signed)
We will try to do Anusol suppositories again to see if insurance will cover.  Advised for him to do Colace daily.  And given samples of MiraLAX to use every other day.  To help get the stool soft.  I have encouraged him to increase his water intake, fiber intake if needed he can use Benefiber as well.  Eat fresh fruits and vegetables more frequently.  Follow-up in 4 to 5 weeks referral to GI if needed.

## 2019-07-05 NOTE — Assessment & Plan Note (Signed)
Asked about quitting: confirms they are currently smokes cigarettes Advise to quit smoking: Educated about QUITTING to reduce the risk of cancer, cardio and cerebrovascular disease. Assess willingness: Unwilling to quit at this time, but is working on cutting back. Assist with counseling and pharmacotherapy: Counseled for 5 minutes and literature provided. Arrange for follow up:  not quitting follow up in 3 months and continue to offer help.   
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
## 2019-07-05 NOTE — Assessment & Plan Note (Signed)
Had elevated BP while in the hospital.  BP today in the office is not elevated.  We will follow-up with this in a month if needed we will add medications.

## 2019-07-05 NOTE — Assessment & Plan Note (Signed)
Encouraged to use MiraLAX, stool softeners, and to adjust diet and increase water intake.

## 2019-07-05 NOTE — Patient Instructions (Signed)
I appreciate the opportunity to provide you with care for your health and wellness. Today we discussed: establish care  Follow up: 5 weeks   No labs or referrals today  Miralax every other day-until bowel movements are easily past  Colace 100 mg daily  Increase water and fiber    Please continue to practice social distancing to keep you, your family, and our community safe.  If you must go out, please wear a mask and practice good handwashing.  It was a pleasure to see you and I look forward to continuing to work together on your health and well-being. Please do not hesitate to call the office if you need care or have questions about your care.  Have a wonderful day and week. With Gratitude, Tereasa Coop, DNP, AGNP-BC   Hemorrhoids Hemorrhoids are swollen veins in and around the rectum or anus. There are two types of hemorrhoids:  Internal hemorrhoids. These occur in the veins that are just inside the rectum. They may poke through to the outside and become irritated and painful.  External hemorrhoids. These occur in the veins that are outside the anus and can be felt as a painful swelling or hard lump near the anus. Most hemorrhoids do not cause serious problems, and they can be managed with home treatments such as diet and lifestyle changes. If home treatments do not help the symptoms, procedures can be done to shrink or remove the hemorrhoids. What are the causes? This condition is caused by increased pressure in the anal area. This pressure may result from various things, including:  Constipation.  Straining to have a bowel movement.  Diarrhea.  Pregnancy.  Obesity.  Sitting for long periods of time.  Heavy lifting or other activity that causes you to strain.  Anal sex.  Riding a bike for a long period of time. What are the signs or symptoms? Symptoms of this condition include:  Pain.  Anal itching or irritation.  Rectal bleeding.  Leakage of stool  (feces).  Anal swelling.  One or more lumps around the anus. How is this diagnosed? This condition can often be diagnosed through a visual exam. Other exams or tests may also be done, such as:  An exam that involves feeling the rectal area with a gloved hand (digital rectal exam).  An exam of the anal canal that is done using a small tube (anoscope).  A blood test, if you have lost a significant amount of blood.  A test to look inside the colon using a flexible tube with a camera on the end (sigmoidoscopy or colonoscopy). How is this treated? This condition can usually be treated at home. However, various procedures may be done if dietary changes, lifestyle changes, and other home treatments do not help your symptoms. These procedures can help make the hemorrhoids smaller or remove them completely. Some of these procedures involve surgery, and others do not. Common procedures include:  Rubber band ligation. Rubber bands are placed at the base of the hemorrhoids to cut off their blood supply.  Sclerotherapy. Medicine is injected into the hemorrhoids to shrink them.  Infrared coagulation. A type of light energy is used to get rid of the hemorrhoids.  Hemorrhoidectomy surgery. The hemorrhoids are surgically removed, and the veins that supply them are tied off.  Stapled hemorrhoidopexy surgery. The surgeon staples the base of the hemorrhoid to the rectal wall. Follow these instructions at home: Eating and drinking   Eat foods that have a lot of fiber in  them, such as whole grains, beans, nuts, fruits, and vegetables.  Ask your health care provider about taking products that have added fiber (fiber supplements).  Reduce the amount of fat in your diet. You can do this by eating low-fat dairy products, eating less red meat, and avoiding processed foods.  Drink enough fluid to keep your urine pale yellow. Managing pain and swelling   Take warm sitz baths for 20 minutes, 3-4 times a  day to ease pain and discomfort. You may do this in a bathtub or using a portable sitz bath that fits over the toilet.  If directed, apply ice to the affected area. Using ice packs between sitz baths may be helpful. ? Put ice in a plastic bag. ? Place a towel between your skin and the bag. ? Leave the ice on for 20 minutes, 2-3 times a day. General instructions  Take over-the-counter and prescription medicines only as told by your health care provider.  Use medicated creams or suppositories as told.  Get regular exercise. Ask your health care provider how much and what kind of exercise is best for you. In general, you should do moderate exercise for at least 30 minutes on most days of the week (150 minutes each week). This can include activities such as walking, biking, or yoga.  Go to the bathroom when you have the urge to have a bowel movement. Do not wait.  Avoid straining to have bowel movements.  Keep the anal area dry and clean. Use wet toilet paper or moist towelettes after a bowel movement.  Do not sit on the toilet for long periods of time. This increases blood pooling and pain.  Keep all follow-up visits as told by your health care provider. This is important. Contact a health care provider if you have:  Increasing pain and swelling that are not controlled by treatment or medicine.  Difficulty having a bowel movement, or you are unable to have a bowel movement.  Pain or inflammation outside the area of the hemorrhoids. Get help right away if you have:  Uncontrolled bleeding from your rectum. Summary  Hemorrhoids are swollen veins in and around the rectum or anus.  Most hemorrhoids can be managed with home treatments such as diet and lifestyle changes.  Taking warm sitz baths can help ease pain and discomfort.  In severe cases, procedures or surgery can be done to shrink or remove the hemorrhoids. This information is not intended to replace advice given to you by  your health care provider. Make sure you discuss any questions you have with your health care provider. Document Revised: 09/02/2018 Document Reviewed: 08/26/2017 Elsevier Patient Education  2020 Else[employer] Inc.   Fiber Content in Foods  GOAL IS 40 GRAMS DAILY   See the following list for the dietary fiber content of some common foods. High-fiber foods High-fiber foods contain 4 grams or more (4g or more) of fiber per serving. They include:  Artichoke (fresh) -- 1 medium has 10.3g of fiber.  Baked beans, plain or vegetarian (canned) --  cup has 5.2g of fiber.  Blackberries or raspberries (fresh) --  cup has 4g of fiber.  Bran cereal --  cup has 8.6g of fiber.  Bulgur (cooked) --  cup has 4g of fiber.  Kidney beans (canned) --  cup has 6.8g of fiber.  Lentils (cooked) --  cup has 7.8g of fiber.  Pear (fresh) -- 1 medium has 5.1g of fiber.  Peas (frozen) --  cup has 4.4g of  fiber.  Pinto beans (canned) --  cup has 5.5g of fiber.  Pinto beans (dried and cooked) --  cup has 7.7g of fiber.  Potato with skin (baked) -- 1 medium has 4.4g of fiber.  Quinoa (cooked) --  cup has 5g of fiber.  Soybeans (canned, frozen, or fresh) --  cup has 5.1g of fiber. Moderate-fiber foods Moderate-fiber foods contain 1-4 grams (1-4g) of fiber per serving. They include:  Almonds -- 1 oz. has 3.5g of fiber.  Apple with skin -- 1 medium has 3.3g of fiber.  Applesauce, sweetened --  cup has 1.5g of fiber.  Bagel, plain -- one 4-inch (10-cm) bagel has 2g of fiber.  Banana -- 1 medium has 3.1g of fiber.  Broccoli (cooked) --  cup has 2.5g of fiber.  Carrots (cooked) --  cup has 2.3g of fiber.  Corn (canned or frozen) --  cup has 2.1g of fiber.  Corn tortilla -- one 6-inch (15-cm) tortilla has 1.5g of fiber.  Green beans (canned) --  cup has 2g of fiber.  Instant oatmeal --  cup has about 2g of fiber.  Long-grain brown rice (cooked) -- 1 cup has 3.5g of  fiber.  Macaroni, enriched (cooked) -- 1 cup has 2.5g of fiber.  Melon -- 1 cup has 1.4g of fiber.  Multigrain cereal --  cup has about 2-4g of fiber.  Orange -- 1 small has 3.1g of fiber.  Potatoes, mashed --  cup has 1.6g of fiber.  Raisins -- 1/4 cup has 1.6g of fiber.  Squash --  cup has 2.9g of fiber.  Sunflower seeds --  cup has 1.1g of fiber.  Tomato -- 1 medium has 1.5g of fiber.  Vegetable or soy patty -- 1 has 3.4g of fiber.  Whole-wheat bread -- 1 slice has 2g of fiber.  Whole-wheat spaghetti --  cup has 3.2g of fiber. Low-fiber foods Low-fiber foods contain less than 1 gram (less than 1g) of fiber per serving. They include:  Egg -- 1 large.  Flour tortilla -- one 6-inch (15-cm) tortilla.  Fruit juice --  cup.  Lettuce -- 1 cup.  Meat, poultry, or fish -- 1 oz.  Milk -- 1 cup.  Spinach (raw) -- 1 cup.  White bread -- 1 slice.  White rice --  cup.  Yogurt --  cup. Actual amounts of fiber in foods may be different depending on processing. Talk with your dietitian about how much fiber you need in your diet. This information is not intended to replace advice given to you by your health care provider. Make sure you discuss any questions you have with your health care provider. Document Revised: 11/28/2015 Document Reviewed: 05/30/2015 Elsevier Patient Education  [place].

## 2019-08-10 ENCOUNTER — Telehealth (INDEPENDENT_AMBULATORY_CARE_PROVIDER_SITE_OTHER): Payer: BC Managed Care – PPO | Admitting: Family Medicine

## 2019-08-10 ENCOUNTER — Other Ambulatory Visit: Payer: Self-pay

## 2019-08-10 ENCOUNTER — Encounter: Payer: Self-pay | Admitting: Family Medicine

## 2019-08-10 VITALS — BP 120/78 | Ht 66.0 in | Wt 160.0 lb

## 2019-08-10 DIAGNOSIS — K649 Unspecified hemorrhoids: Secondary | ICD-10-CM

## 2019-08-10 NOTE — Patient Instructions (Signed)
I appreciate the opportunity to provide you with care for your health and wellness. Today we discussed: hemorrhoids  Follow up: 3-4 months   No labs or referrals today  Referral at next visit if still having discomfort. Continue stool softener and miralax as needed  Avoid straining. Increase water and fiber intake  Please continue to practice social distancing to keep you, your family, and our community safe.  If you must go out, please wear a mask and practice good handwashing.  It was a pleasure to see you and I look forward to continuing to work together on your health and well-being. Please do not hesitate to call the office if you need care or have questions about your care.  Have a wonderful day and week. With Gratitude, Tereasa Coop, DNP, AGNP-BC

## 2019-08-10 NOTE — Assessment & Plan Note (Signed)
Improved, still having some pain when "harder or straining" with movement.
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
## 2019-09-26 ENCOUNTER — Ambulatory Visit: Payer: BC Managed Care – PPO | Admitting: Family Medicine

## 2019-11-01 ENCOUNTER — Other Ambulatory Visit: Payer: Self-pay

## 2019-11-01 ENCOUNTER — Telehealth: Payer: Self-pay | Admitting: Family Medicine

## 2019-11-01 DIAGNOSIS — K649 Unspecified hemorrhoids: Secondary | ICD-10-CM

## 2019-11-01 NOTE — Telephone Encounter (Signed)
Please refer to GI.

## 2019-11-01 NOTE — Telephone Encounter (Signed)
Pt is wanting a referral for hemorids  per the phone visit  April

## 2019-11-01 NOTE — Telephone Encounter (Signed)
Referral placed for GI 

## 2019-11-09 ENCOUNTER — Encounter (INDEPENDENT_AMBULATORY_CARE_PROVIDER_SITE_OTHER): Payer: Self-pay | Admitting: Gastroenterology

## 2019-11-09 ENCOUNTER — Ambulatory Visit (INDEPENDENT_AMBULATORY_CARE_PROVIDER_SITE_OTHER): Payer: BC Managed Care – PPO | Admitting: Gastroenterology

## 2019-11-09 ENCOUNTER — Other Ambulatory Visit: Payer: Self-pay

## 2019-11-09 DIAGNOSIS — K602 Anal fissure, unspecified: Secondary | ICD-10-CM | POA: Diagnosis not present

## 2019-11-09 MED ORDER — DILTIAZEM GEL 2 %: 1.0000 "application " | Freq: Two times a day (BID) | CUTANEOUS | 2 refills | Status: DC

## 2019-11-09 MED ORDER — HYDROCORTISONE ACETATE 25 MG RE SUPP: 25.0000 mg | Freq: Every day | RECTAL | 0 refills | Status: DC

## 2019-11-09 NOTE — Patient Instructions (Addendum)
Start Anusol cream daily to numb the rectal area Start diltiazem cream twice a day to help area healing Start stool softener daily Take prune juice and eat kiwi daily
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
## 2019-11-15 ENCOUNTER — Other Ambulatory Visit (INDEPENDENT_AMBULATORY_CARE_PROVIDER_SITE_OTHER): Payer: Self-pay | Admitting: Gastroenterology

## 2019-11-15 NOTE — Progress Notes (Signed)
Encounter opened to prescribe diltiazem 2% ointment. I called the pharmacy to place the order.  Dolores Frame, MD Gastroenterology and Hepatology [facility]

## 2019-12-12 ENCOUNTER — Telehealth (INDEPENDENT_AMBULATORY_CARE_PROVIDER_SITE_OTHER): Payer: BC Managed Care – PPO | Admitting: Family Medicine

## 2019-12-12 ENCOUNTER — Encounter: Payer: Self-pay | Admitting: Family Medicine

## 2019-12-12 ENCOUNTER — Other Ambulatory Visit: Payer: Self-pay

## 2019-12-12 VITALS — Ht 66.0 in | Wt 160.0 lb

## 2019-12-12 DIAGNOSIS — F17210 Nicotine dependence, cigarettes, uncomplicated: Secondary | ICD-10-CM

## 2019-12-12 DIAGNOSIS — R030 Elevated blood-pressure reading, without diagnosis of hypertension: Secondary | ICD-10-CM | POA: Diagnosis not present

## 2019-12-12 DIAGNOSIS — K602 Anal fissure, unspecified: Secondary | ICD-10-CM

## 2019-12-12 NOTE — Assessment & Plan Note (Signed)
Improved. Continue treat as prescribed

## 2019-12-12 NOTE — Assessment & Plan Note (Signed)
  3-5 a day  Asked about quitting: confirms they are currently smokes cigarettes Advise to quit smoking: Educated about QUITTING to reduce the risk of cancer, cardio and cerebrovascular disease. Assess willingness: Unwilling to quit at this time, but is working on cutting back. Assist with counseling and pharmacotherapy: Counseled for 5 minutes and literature provided. Arrange for follow up: working on it quitting follow up in 3 months and continue to offer help.
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
## 2019-12-12 NOTE — Assessment & Plan Note (Signed)
Has not had elevated BP that he is aware of. He denies S&S of it. Continue DASH diet and exercise 30 mins daily is encourage.

## 2019-12-12 NOTE — Patient Instructions (Signed)
I appreciate the opportunity to provide you with care for your health and wellness. Today we discussed: smoking   Follow up: March 2022 for annual visit-morning for fasting labs same day  No labs or referrals today  Continue to work on smoking cessation! You can do it! So close!  Please continue to practice social distancing to keep you, your family, and our community safe.  If you must go out, please wear a mask and practice good handwashing.  It was a pleasure to see you and I look forward to continuing to work together on your health and well-being. Please do not hesitate to call the office if you need care or have questions about your care.  Have a wonderful day and week. With Gratitude, Tereasa Coop, DNP, AGNP-BC

## 2020-02-12 ENCOUNTER — Ambulatory Visit (INDEPENDENT_AMBULATORY_CARE_PROVIDER_SITE_OTHER): Payer: BC Managed Care – PPO | Admitting: Gastroenterology

## 2020-02-15 ENCOUNTER — Other Ambulatory Visit: Payer: Self-pay

## 2020-02-15 ENCOUNTER — Ambulatory Visit (INDEPENDENT_AMBULATORY_CARE_PROVIDER_SITE_OTHER): Payer: BC Managed Care – PPO | Admitting: Gastroenterology

## 2020-02-15 ENCOUNTER — Encounter (INDEPENDENT_AMBULATORY_CARE_PROVIDER_SITE_OTHER): Payer: Self-pay | Admitting: Gastroenterology

## 2020-02-15 VITALS — BP 118/80 | HR 83 | Temp 99.2°F | Ht 66.0 in | Wt 178.9 lb

## 2020-02-15 DIAGNOSIS — K602 Anal fissure, unspecified: Secondary | ICD-10-CM | POA: Diagnosis not present

## 2020-02-15 DIAGNOSIS — K6289 Other specified diseases of anus and rectum: Secondary | ICD-10-CM | POA: Diagnosis not present
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
## 2020-02-15 NOTE — Patient Instructions (Signed)
Decrease diltiazem to once a day for 2 to 3 weeks then stop.  Would start stool softener & make sure you are getting plenty of fiber and fluid in your diet.  If pain returns please notify me.

## 2020-06-20 ENCOUNTER — Encounter: Payer: Self-pay | Admitting: Orthopedic Surgery

## 2020-06-20 ENCOUNTER — Other Ambulatory Visit: Payer: Self-pay

## 2020-06-20 ENCOUNTER — Ambulatory Visit: Payer: BC Managed Care – PPO | Admitting: Orthopedic Surgery

## 2020-06-20 ENCOUNTER — Ambulatory Visit: Payer: BC Managed Care – PPO

## 2020-06-20 ENCOUNTER — Telehealth: Payer: Self-pay | Admitting: Orthopedic Surgery

## 2020-06-20 VITALS — BP 158/111 | HR 94 | Ht 66.0 in | Wt 170.0 lb

## 2020-06-20 DIAGNOSIS — M25521 Pain in right elbow: Secondary | ICD-10-CM

## 2020-06-20 DIAGNOSIS — M7711 Lateral epicondylitis, right elbow: Secondary | ICD-10-CM | POA: Diagnosis not present

## 2020-06-20 MED ORDER — PREDNISONE 10 MG (48) PO TBPK: ORAL_TABLET | Freq: Every day | ORAL | 0 refills | Status: DC

## 2020-06-20 NOTE — Patient Instructions (Signed)
Tennis Elbow  Tennis elbow is irritation and swelling (inflammation) in your outer forearm, near your elbow. Swelling affects the tissues that connect muscle to bone (tendons). Tennis elbow can happen playing any sport or doing any job where you use your elbow too much. It is caused by doing the same motion over and over. What are the causes? This condition is often caused by playing sports or doing work where you need to keep moving your forearm the same way. Sometimes, it may be caused by a sudden injury. What increases the risk? You are more likely to get tennis elbow if you play tennis or another racket sport. You also have a higher risk if you often use your hands for work. This includes:  People who use computers.  Construction workers.  People who work in a factory.  Musicians.  Cooks.  Cashiers. What are the signs or symptoms?  Pain and tenderness in your forearm and the outer part of your elbow. You may have pain all the time or only when you use your arm.  A burning feeling. This starts in your elbow and spreads down your arm.  A weak grip in your hand. How is this treated? Resting and icing your arm is often the first treatment. Your doctor may also recommend:  Medicines to reduce pain and swelling.  An elbow strap.  Physical therapy. This may include massage or exercises or both.  An elbow brace. If these do not help your symptoms get better, your doctor may recommend surgery. Follow these instructions at home: If you have a brace or strap:  Wear the brace or strap as told by your doctor. Take it off only as told by your doctor.  Check the skin around the brace or strap every day. Tell your doctor if you see problems.  Loosen it if your fingers: ? Tingle. ? Become numb. ? Turn cold and blue.  Keep the brace or strap clean.  If the brace or strap is not waterproof: ? Do not let it get wet. ? Cover it with a watertight covering when you take a bath or a  shower. Managing pain, stiffness, and swelling  If told, put ice on the injured area. To do this: ? If you have a removable brace or strap, take it off as told by your doctor. ? Put ice in a plastic bag. ? Place a towel between your skin and the bag. ? Leave the ice on for 20 minutes, 2-3 times a day. ? Take off the ice if your skin turns bright red. This is very important. If you cannot feel pain, heat, or cold, you have a greater risk of damage to the area.  Move your fingers often.   Activity  Rest your elbow and wrist. Avoid activities that can cause elbow problems as told by your doctor.  Do exercises as told by your doctor.  If you lift an object, lift it with your palm facing up. Lifestyle  If your tennis elbow is caused by sports, check your equipment and make sure that: ? You are using it the right way. ? It fits you well.  If your tennis elbow is caused by work or computer use, take breaks often to stretch your arm. Talk with your manager about how you can make your condition better at work. General instructions  Take over-the-counter and prescription medicines only as told by your doctor.  Do not smoke or use any products that contain nicotine or tobacco.   If you need help quitting, ask your doctor.  Keep all follow-up visits. How is this prevented?  Before and after being active: ? Warm up and stretch before being active. ? Cool down and stretch after being active. ? Give your body time to rest between activities.  While being active: ? Make sure to use equipment that fits you. ? If you play tennis, put power in your stroke with your lower body. Avoid using your arm only.  Maintain physical fitness. This includes: ? Strength. ? Flexibility. ? Endurance.  Do exercises to strengthen the forearm muscles. Contact a doctor if:  Your pain does not get better with treatment.  Your pain gets worse.  You have weakness in your forearm, hand, or fingers.  You  cannot feel your forearm, hand, or fingers. Get help right away if:  Your pain is very bad.  You cannot move your wrist. Summary  Tennis elbow is irritation and swelling (inflammation) in your outer forearm, near your elbow.  Tennis elbow is caused by doing the same motion over and over.  Rest your elbow and wrist. Avoid activities as told by your doctor.  If told, put ice on the injured area for 20 minutes, 2-3 times a day. This information is not intended to replace advice given to you by your health care provider. Make sure you discuss any questions you have with your health care provider. Document Revised: 10/17/2019 Document Reviewed: 10/17/2019 Elsevier Patient Education  2021 [employer]  
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
## 2020-06-20 NOTE — Telephone Encounter (Signed)
Called patient to notify, no follow up needed.

## 2020-06-20 NOTE — Telephone Encounter (Signed)
-----   Message from Caffie Damme, RT sent at 06/20/2020  3:55 PM EST ----- Regarding: FW: Return for fol/up?  ----- Message ----- From: Vickki Hearing, MD Sent: 06/20/2020   3:54 PM EST To: Caffie Damme, RT Subject: RE: Return for fol/up?                         No f/u  ----- Message ----- From: Caffie Damme, RT Sent: 06/20/2020   3:50 PM EST To: Vickki Hearing, MD Subject: FW: Return for fol/up?                          ----- Message ----- From: Doristine Section Sent: 06/20/2020   3:39 PM EST To: Caffie Damme, RT Subject: Return for fol/up?                             Billy Lynn [id number] - Avs states to return, but when is he to come back? He thought no return needed. Thank you

## 2020-06-21 ENCOUNTER — Encounter: Payer: Self-pay | Admitting: Family Medicine

## 2020-06-21 ENCOUNTER — Ambulatory Visit (INDEPENDENT_AMBULATORY_CARE_PROVIDER_SITE_OTHER): Payer: BC Managed Care – PPO | Admitting: Family Medicine

## 2020-06-21 VITALS — BP 144/78 | HR 113 | Temp 98.5°F | Ht 66.0 in | Wt 176.0 lb

## 2020-06-21 DIAGNOSIS — F17210 Nicotine dependence, cigarettes, uncomplicated: Secondary | ICD-10-CM | POA: Diagnosis not present

## 2020-06-21 DIAGNOSIS — Z0001 Encounter for general adult medical examination with abnormal findings: Secondary | ICD-10-CM | POA: Diagnosis not present

## 2020-06-21 DIAGNOSIS — L989 Disorder of the skin and subcutaneous tissue, unspecified: Secondary | ICD-10-CM

## 2020-06-21 DIAGNOSIS — E663 Overweight: Secondary | ICD-10-CM | POA: Diagnosis not present

## 2020-06-21 DIAGNOSIS — I10 Essential (primary) hypertension: Secondary | ICD-10-CM | POA: Diagnosis not present

## 2020-06-21 NOTE — Progress Notes (Signed)
Health Maintenance reviewed -  Immunization History  Administered Date(s) Administered  . Janssen (J&J) SARS-COV-2 Vaccination 07/20/2019  . Tdap 03/16/2013   Last colonoscopy: n/a  Last PSA: n/a Dentist: Needs to make an appt-  1-2 years ago Ophtho: Vision is good- has not seen an eye dr Exercise: No currently  Smoker: 1/2 pk cigarettes  Alcohol Use: limited   Other doctors caring for patient include:  Patient Care Team: Freddy Finner, NP as PCP - General (Family Medicine)  End of Life Discussion:  Patient has a living will and medical power of attorney. Copy requested  Subjective:   HPI  Billy Lynn is a 45 y.o. male who presents for annual visit and follow-up on chronic medical conditions.  He has the following concerns: small bump on neck would like to see about getting it removed. Has had in the past and derm removed them, but wanted to see if we could. No pain with it. No drainage. No infection at site.  Reports sleeping well. Denies trouble with chewing or swallowing. Denies issues or changes with bladder or bowel habits- no blood in urine or stool. Reports taking a baby ASA but has never been told he needed to. He takes a stool softener as needed to help with hemorrhoids overall these have improved. Denies having recent falls or injuries. Denies having vision or hearing changes. Reports good memory.   Review Of Systems  Review of Systems  Constitutional: Negative.   HENT: Negative.   Respiratory: Negative.   Cardiovascular: Negative.   Gastrointestinal: Negative.   Endocrine: Negative.   Genitourinary: Negative.   Musculoskeletal: Negative.   Skin: Negative.        Bump on side of neck   Neurological: Negative.   Psychiatric/Behavioral: Negative.     Objective:   PHYSICAL EXAM:  BP (!) 144/78 (BP Location: Left Arm, Patient Position: Sitting, Cuff Size: Normal)   Pulse (!) 113   Temp 98.5 F (36.9 C) (Oral)   Ht 5\' 6"  (1.676 m)   Wt 176 lb  (79.8 kg)   SpO2 95%   BMI 28.41 kg/m   Physical Exam Vitals and nursing note reviewed.  Constitutional:      Appearance: Normal appearance. He is well-developed, well-groomed and overweight.  HENT:     Head: Normocephalic and atraumatic.     Right Ear: Hearing, ear canal and external ear normal.     Left Ear: Hearing, tympanic membrane, ear canal and external ear normal.     Ears:     Comments: Moderate cerumen impaction on Right    Mouth/Throat:     Comments: Mask in place  Eyes:     General: Lids are normal.        Right eye: No discharge.        Left eye: No discharge.     Extraocular Movements: Extraocular movements intact.     Conjunctiva/sclera: Conjunctivae normal.     Pupils: Pupils are equal, round, and reactive to light.  Neck:     Thyroid: No thyroid mass, thyromegaly or thyroid tenderness.     Vascular: No carotid bruit.     Comments: Small bump vs cyst vs comedone on Right side of neck  Cardiovascular:     Rate and Rhythm: Normal rate and regular rhythm.     Pulses: Normal pulses.     Heart sounds: Normal heart sounds.  Pulmonary:     Effort: Pulmonary effort is normal.     Breath  sounds: Normal breath sounds.  Abdominal:     General: Bowel sounds are normal. There is no distension.     Palpations: Abdomen is soft.     Tenderness: There is no abdominal tenderness. There is no right CVA tenderness or left CVA tenderness.     Hernia: No hernia is present.  Musculoskeletal:        General: Normal range of motion.     Cervical back: Full passive range of motion without pain, normal range of motion and neck supple.     Right lower leg: No edema.     Left lower leg: No edema.  Skin:    General: Skin is warm and dry.     Capillary Refill: Capillary refill takes less than 2 seconds.  Neurological:     General: No focal deficit present.     Mental Status: He is alert and oriented to person, place, and time.     Cranial Nerves: Cranial nerves are intact.      Sensory: Sensation is intact.     Motor: Motor function is intact.     Coordination: Coordination is intact.     Gait: Gait is intact.     Deep Tendon Reflexes: Reflexes are normal and symmetric.  Psychiatric:        Attention and Perception: Attention and perception normal.        Mood and Affect: Mood and affect normal.        Speech: Speech normal.        Behavior: Behavior normal. Behavior is cooperative.        Thought Content: Thought content normal.        Cognition and Memory: Cognition and memory normal.        Judgment: Judgment normal.      Depression Screening  Depression screen [facility] 2/9 06/21/2020 12/12/2019 07/05/2019  Decreased Interest 0 0 0  Down, Depressed, Hopeless 0 0 0  PHQ - 2 Score 0 0 0     Falls  Fall Risk  06/21/2020 12/12/2019 08/10/2019  Falls in the past year? 0 0 0  Number falls in past yr: 0 - 0  Injury with Fall? 0 - 0  Risk for fall due to : No Fall Risks - -  Follow up Falls evaluation completed - -    Assessment & Plan:   1. Annual visit for general adult medical examination with abnormal findings   2. Primary hypertension   3. Cigarette nicotine dependence without complication   4. Overweight (BMI 25.0-29.9)   5. Bumps on skin     Tests ordered Orders Placed This Encounter  Procedures  . CBC  . Comprehensive metabolic panel  . Hemoglobin A1c  . Lipid panel  . TSH   Plan: Please see assessment and plan per problem list above.   No orders of the defined types were placed in this encounter.   I have personally reviewed: The patient's medical and social history Their use of alcohol, tobacco or illicit drugs Their current medications and supplements The patient's functional ability including ADLs,fall risks, home safety risks, cognitive, and hearing and visual impairment Diet and physical activities Evidence for depression or mood disorders  The patient's weight, height, BMI, and visual acuity have been recorded in the chart.   I have made referrals, counseling, and provided education to the patient based on review of the above and I have provided the patient with a written personalized care plan for preventive services.  Freddy Finner, NP   06/21/2020

## 2020-06-21 NOTE — Assessment & Plan Note (Signed)
Bump on the side of neck- diff cyst vs comedone would like it removed. Will have appt with Casimiro Needle set up for this.  Patient acknowledged agreement and understanding of the plan.

## 2020-06-21 NOTE — Assessment & Plan Note (Signed)
Discussed PSA screening (risks/benefits), recommended at least 30 minutes of aerobic activity at least 5 days/week; proper sunscreen use reviewed; healthy diet and alcohol recommendations (less than or equal to 2 drinks/day) reviewed; regular seatbelt use; changing batteries in smoke detectors. Immunization recommendations discussed.  Colonoscopy recommendations reviewed. ° °

## 2020-06-21 NOTE — Assessment & Plan Note (Signed)
Deteriorated  Billy Lynn is re-educated about the importance of exercise daily to help with weight management. A minumum of 30 minutes daily is recommended. Additionally, importance of healthy food choices  with portion control discussed.   Wt Readings from Last 3 Encounters:  06/21/20 176 lb (79.8 kg)  06/20/20 170 lb (77.1 kg)  02/15/20 178 lb 14.4 oz (81.1 kg)

## 2020-06-21 NOTE — Assessment & Plan Note (Signed)
Asked about quitting: confirms they are currently smokes cigarettes 1/2 pk daily  Advise to quit smoking: Educated about QUITTING to reduce the risk of cancer, cardio and cerebrovascular disease. Assess willingness: Unwilling to quit at this time, but is working on cutting back. Assist with counseling and pharmacotherapy: Counseled for 5 minutes and literature provided. Arrange for follow up: not quitting follow up in 3 months and continue to offer help.

## 2020-06-21 NOTE — Assessment & Plan Note (Signed)
Reports he just smoked and that is why his BP is elevated  Advised to stop smoking Will continue to monitor- might benefit from being on something low dose in near future Patient acknowledged agreement and understanding of the plan.

## 2020-06-21 NOTE — Patient Instructions (Addendum)
I appreciate the opportunity to provide you with care for your health and wellness.  Follow up: 3 weeks with Casimiro Needle to remove cyst/comedone on side of neck & a 6 months for BP follow up  Labs in 1-2 weeks (fasting, do not eat 8 hours before getting lab work)  Work note today  HEALTH MAINTENANCE RECOMMENDATIONS:  Colonoscopy at age 45 is not recommended in the guidelines.  It is recommended that you get at least 30 minutes of aerobic exercise at least 5 days/week (for weight loss, you may need as much as 60-90 minutes). This can be any activity that gets your heart rate up. This can be divided in 10-15 minute intervals if needed, but try and build up your endurance at least once a week.  Weight bearing exercise is also recommended twice weekly.  Eat a healthy diet with lots of vegetables, fruits and fiber.  "Colorful" foods have a lot of vitamins (ie green vegetables, tomatoes, red peppers, etc).  Limit sweet tea, regular sodas and alcoholic beverages, all of which has a lot of calories and sugar.  Up to 2 alcoholic drinks daily may be beneficial for men (unless trying to lose weight, watch sugars).  Drink a lot of water.  Sunscreen of at least SPF 30 should be used on all sun-exposed parts of the skin when outside between the hours of 10 am and 4 pm (not just when at beach or pool, but even with exercise, golf, tennis, and yard work!)  Use a sunscreen that says "broad spectrum" so it covers both UVA and UVB rays, and make sure to reapply every 1-2 hours.  Remember to change the batteries in your smoke detectors when changing your clock times in the spring and fall.  Use your seat belt every time you are in a car, and please drive safely and not be distracted with cell phones and texting while driving.  Please continue to practice social distancing to keep you, your family, and our community safe.  If you must go out, please wear a mask and practice good handwashing.  It was a pleasure to  see you and I look forward to continuing to work together on your health and well-being. Please do not hesitate to call the office if you need care or have questions about your care.  Have a wonderful day. With Gratitude, Tereasa Coop, DNP, AGNP-BC

## 2020-06-28 LAB — HEMOGLOBIN A1C
Est. average glucose Bld gHb Est-mCnc: 120 mg/dL
Hgb A1c MFr Bld: 5.8 % — ABNORMAL HIGH (ref 4.8–5.6)

## 2020-06-28 LAB — COMPREHENSIVE METABOLIC PANEL
ALT: 31 IU/L (ref 0–44)
AST: 29 IU/L (ref 0–40)
Albumin/Globulin Ratio: 1.8 (ref 1.2–2.2)
Albumin: 4.6 g/dL (ref 4.0–5.0)
Alkaline Phosphatase: 57 IU/L (ref 44–121)
BUN/Creatinine Ratio: 11 (ref 9–20)
BUN: 12 mg/dL (ref 6–24)
Bilirubin Total: 0.4 mg/dL (ref 0.0–1.2)
CO2: 21 mmol/L (ref 20–29)
Calcium: 9.4 mg/dL (ref 8.7–10.2)
Chloride: 99 mmol/L (ref 96–106)
Creatinine, Ser: 1.12 mg/dL (ref 0.76–1.27)
Globulin, Total: 2.5 g/dL (ref 1.5–4.5)
Glucose: 95 mg/dL (ref 65–99)
Potassium: 4 mmol/L (ref 3.5–5.2)
Sodium: 138 mmol/L (ref 134–144)
Total Protein: 7.1 g/dL (ref 6.0–8.5)
eGFR: 84 mL/min/{1.73_m2} (ref >59)

## 2020-06-28 LAB — CBC
Hematocrit: 43.1 % (ref 37.5–51.0)
Hemoglobin: 14.4 g/dL (ref 13.0–17.7)
MCH: 25.7 pg — ABNORMAL LOW (ref 26.6–33.0)
MCHC: 33.4 g/dL (ref 31.5–35.7)
MCV: 77 fL — ABNORMAL LOW (ref 79–97)
Platelets: 203 10*3/uL (ref 150–450)
RBC: 5.61 x10E6/uL (ref 4.14–5.80)
RDW: 13.4 % (ref 11.6–15.4)
WBC: 6.3 10*3/uL (ref 3.4–10.8)

## 2020-06-28 LAB — LIPID PANEL
Chol/HDL Ratio: 3.6 ratio (ref 0.0–5.0)
Cholesterol, Total: 246 mg/dL — ABNORMAL HIGH (ref 100–199)
HDL: 69 mg/dL (ref >39)
LDL Chol Calc (NIH): 160 mg/dL — ABNORMAL HIGH (ref 0–99)
Triglycerides: 97 mg/dL (ref 0–149)
VLDL Cholesterol Cal: 17 mg/dL (ref 5–40)

## 2020-06-28 LAB — TSH: TSH: 1.23 u[IU]/mL (ref 0.450–4.500)

## 2020-07-03 ENCOUNTER — Encounter: Payer: BC Managed Care – PPO | Admitting: Family Medicine

## 2020-07-12 ENCOUNTER — Other Ambulatory Visit: Payer: Self-pay

## 2020-07-12 ENCOUNTER — Encounter: Payer: Self-pay | Admitting: Nurse Practitioner

## 2020-07-12 ENCOUNTER — Ambulatory Visit: Payer: BC Managed Care – PPO | Admitting: Nurse Practitioner

## 2020-07-12 DIAGNOSIS — L700 Acne vulgaris: Secondary | ICD-10-CM

## 2020-07-12 NOTE — Assessment & Plan Note (Addendum)
-  deroofed today without issues -I&D today- injected 0.25 mL of lidocaine after using lidocaine topical antiseptic. After local anesthesia was reached, used a #11 blade to make a small incision to top of comedone. Was able to remove some debris from inside of comedone, but comedone was overlying his carotid artery, so no deep incision was made. Hemostasis achieved prior to discharge, and a bandaid was applied. -will get derm referral if the comedone is persistent
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
## 2020-12-04 ENCOUNTER — Other Ambulatory Visit: Payer: Self-pay

## 2020-12-04 ENCOUNTER — Encounter: Payer: Self-pay | Admitting: Nurse Practitioner

## 2020-12-04 ENCOUNTER — Telehealth: Payer: BC Managed Care – PPO | Admitting: Nurse Practitioner

## 2020-12-04 VITALS — Ht 66.0 in | Wt 160.0 lb

## 2020-12-04 DIAGNOSIS — K649 Unspecified hemorrhoids: Secondary | ICD-10-CM | POA: Diagnosis not present

## 2020-12-04 MED ORDER — HYDROCORTISONE (PERIANAL) 2.5 % EX CREA: 1.0000 "application " | TOPICAL_CREAM | Freq: Two times a day (BID) | CUTANEOUS | 0 refills | Status: DC

## 2020-12-04 NOTE — Assessment & Plan Note (Signed)
-  has rectal pain several minutes after having a BM -Rx. anusol -if no improvement my Monday, call the clinic and we can consider GI consult
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
## 2020-12-04 NOTE — Patient Instructions (Signed)
Limited ability to treat rectal pain via telehealth.  I sent in anusol cream to help with pain. If no improvement by Monday call the clinic.

## 2020-12-18 ENCOUNTER — Telehealth: Payer: Self-pay

## 2020-12-18 ENCOUNTER — Other Ambulatory Visit: Payer: Self-pay

## 2020-12-18 DIAGNOSIS — K649 Unspecified hemorrhoids: Secondary | ICD-10-CM

## 2020-12-18 DIAGNOSIS — K602 Anal fissure, unspecified: Secondary | ICD-10-CM

## 2020-12-18 NOTE — Telephone Encounter (Signed)
Patient called said the cream hydrocortisone (ANUSOL-HC) 2.5 % rectal cream  is not working said would refer to [employer]. Painful after has a bowel movement.  Call back # [phone]

## 2020-12-18 NOTE — Telephone Encounter (Signed)
Sure

## 2020-12-18 NOTE — Telephone Encounter (Signed)
Referral sent. Pt informed via voicemail.

## 2020-12-26 ENCOUNTER — Encounter (INDEPENDENT_AMBULATORY_CARE_PROVIDER_SITE_OTHER): Payer: Self-pay | Admitting: *Deleted

## 2020-12-27 ENCOUNTER — Ambulatory Visit: Payer: BC Managed Care – PPO | Admitting: Nurse Practitioner

## 2020-12-27 ENCOUNTER — Ambulatory Visit: Payer: BC Managed Care – PPO | Admitting: Family Medicine

## 2021-01-23 ENCOUNTER — Other Ambulatory Visit: Payer: Self-pay

## 2021-01-23 ENCOUNTER — Ambulatory Visit (INDEPENDENT_AMBULATORY_CARE_PROVIDER_SITE_OTHER): Payer: BC Managed Care – PPO | Admitting: Gastroenterology

## 2021-01-23 ENCOUNTER — Encounter (INDEPENDENT_AMBULATORY_CARE_PROVIDER_SITE_OTHER): Payer: Self-pay | Admitting: Gastroenterology

## 2021-01-23 VITALS — BP 134/85 | HR 91 | Temp 99.1°F | Ht 66.0 in | Wt 176.9 lb

## 2021-01-23 DIAGNOSIS — K602 Anal fissure, unspecified: Secondary | ICD-10-CM

## 2021-01-23 MED ORDER — DILTIAZEM GEL 2 %: 1.0000 "application " | Freq: Two times a day (BID) | CUTANEOUS | 1 refills | Status: DC

## 2021-01-23 NOTE — Patient Instructions (Signed)
Start taking Miralax 1 capful every day to make the stools softer Start diltiazem cream for 3 months
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
## 2022-03-31 ENCOUNTER — Encounter: Payer: Self-pay | Admitting: Family Medicine

## 2022-03-31 ENCOUNTER — Ambulatory Visit: Payer: BC Managed Care – PPO | Admitting: Family Medicine

## 2022-03-31 VITALS — BP 142/82 | HR 89 | Ht 66.0 in | Wt 186.1 lb

## 2022-03-31 DIAGNOSIS — M545 Low back pain, unspecified: Secondary | ICD-10-CM

## 2022-03-31 DIAGNOSIS — R7301 Impaired fasting glucose: Secondary | ICD-10-CM

## 2022-03-31 DIAGNOSIS — Z1159 Encounter for screening for other viral diseases: Secondary | ICD-10-CM

## 2022-03-31 DIAGNOSIS — E559 Vitamin D deficiency, unspecified: Secondary | ICD-10-CM | POA: Diagnosis not present

## 2022-03-31 DIAGNOSIS — E7849 Other hyperlipidemia: Secondary | ICD-10-CM

## 2022-03-31 DIAGNOSIS — I10 Essential (primary) hypertension: Secondary | ICD-10-CM

## 2022-03-31 DIAGNOSIS — E038 Other specified hypothyroidism: Secondary | ICD-10-CM

## 2022-03-31 MED ORDER — CYCLOBENZAPRINE HCL 5 MG PO TABS: 5.0000 mg | ORAL_TABLET | Freq: Three times a day (TID) | ORAL | 1 refills | Status: DC | PRN

## 2022-03-31 MED ORDER — NAPROXEN 500 MG PO TABS: 500.0000 mg | ORAL_TABLET | Freq: Two times a day (BID) | ORAL | 0 refills | Status: DC

## 2022-03-31 NOTE — Patient Instructions (Signed)
I appreciate the opportunity to provide care to you today!    Follow up:  2 weeks for BP  Labs: please stop by the lab today to get your blood drawn (CBC, CMP, TSH, Lipid profile, HgA1c, Vit D)  Screening: HIV and Hep C Conservative managements for Low Back Pain include Heat therapy --  helps reduce muscle spasm Cold Therapy- helps reduce edema  I sent your prescription for Flexeril, please take at bedtime due to the side effects of drowsiness  Naproxen 500 mg twice daily has been ordered for pain control I recommend alternate with ice and heat to the affected sites If directed, put ice on the affected area. To do this: Put ice in a plastic bag. Place a towel between your skin and the bag. Leave the ice on for 20 minutes, 2-3 times a day. Remove the ice if your skin turns bright red. This is very important. If you cannot feel pain, heat, or cold, you have a greater risk of damage to the area. If directed, apply heat to the affected area.Use the heat source that your health care provider recommends, such as a moist heat pack or a heating pad. Place a towel between your skin and the heat source. Leave the heat on for 20-30 minutes. Remove the heat if your skin turns bright red. This is especially important if you are unable to feel pain, heat, or cold. You have a greater risk of getting burned.   Please continue to a heart-healthy diet and increase your physical activities. Try to exercise for at least three times a week.      It was a pleasure to see you and I look forward to continuing to work together on your health and well-being. Please do not hesitate to call the office if you need care or have questions about your care.   Have a wonderful day and week. With Gratitude, Gilmore Laroche MSN, FNP-BC

## 2022-03-31 NOTE — Assessment & Plan Note (Signed)
BP elevated today in the clinic Reports that he is not on pharmacological treatment for his blood pressure Reports eating a high sodium meal before his appointment We will follow-up on BP in 2 weeks Encouraged low-sodium diet with increased physical activities BP Readings from Last 3 Encounters:  03/31/22 (!) 142/82  01/23/21 134/85  07/12/20 129/84

## 2022-03-31 NOTE — Assessment & Plan Note (Signed)
Onset of symptoms a week ago Complains of soreness and stiffness of the right right side of the lumbar spine Pain and tenderness is not radiating He rates pain 8 out of 10 and is aggravated with activities No changes in bowel or bladder No fever or chills Encourage origin with heat and cold application Flexeril 5 mg 3 times daily ordered, and encouraged to take at bedtime due to side effects of drowsiness Will provide a short course of naproxen 500 mg to take twice daily to decrease inflammation and help control pain Encouraged to avoid activities that aggravates pain and discomfort at the affected site
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
## 2022-04-09 ENCOUNTER — Telehealth: Payer: Self-pay | Admitting: Family Medicine

## 2022-04-09 NOTE — Telephone Encounter (Signed)
Spoke with patient, he has an appt 12/26.

## 2022-04-09 NOTE — Telephone Encounter (Signed)
Patient calling in regard to visit on 12/12,  Wants to see if provider can send in higher dose on   cyclobenzaprine (FLEXERIL) 5 MG tablet   naproxen (NAPROSYN) 500 MG tablet    Current dosage is not helping.   Patient wants a call back.

## 2022-04-09 NOTE — Telephone Encounter (Signed)
Please ask the patient to schedule a televisit or office visit for evaluation for a higher medication dosage.

## 2022-04-14 ENCOUNTER — Encounter: Payer: Self-pay | Admitting: Family Medicine

## 2022-04-14 ENCOUNTER — Ambulatory Visit: Payer: BC Managed Care – PPO | Admitting: Family Medicine

## 2022-04-14 VITALS — BP 158/94 | HR 91 | Ht 66.0 in | Wt 184.0 lb

## 2022-04-14 DIAGNOSIS — M545 Low back pain, unspecified: Secondary | ICD-10-CM | POA: Diagnosis not present

## 2022-04-14 DIAGNOSIS — I10 Essential (primary) hypertension: Secondary | ICD-10-CM | POA: Diagnosis not present

## 2022-04-14 MED ORDER — METHOCARBAMOL 500 MG PO TABS: 500.0000 mg | ORAL_TABLET | Freq: Four times a day (QID) | ORAL | 0 refills | Status: DC

## 2022-04-14 MED ORDER — METHOCARBAMOL 500 MG PO TABS: 500.0000 mg | ORAL_TABLET | Freq: Two times a day (BID) | ORAL | 0 refills | Status: DC | PRN

## 2022-04-14 MED ORDER — OLMESARTAN MEDOXOMIL 20 MG PO TABS: 20.0000 mg | ORAL_TABLET | Freq: Every day | ORAL | 1 refills | Status: DC
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
## 2022-04-14 NOTE — Assessment & Plan Note (Signed)
He reports minimal relief of his symptoms with Flexeril 5 mg 3 times daily Will d/c Flexeril and start patient on Robaxin 500 mg twice daily Encouraged heat application to the affected site and to continue taking naproxen as needed for pain

## 2022-04-14 NOTE — Patient Instructions (Addendum)
I appreciate the opportunity to provide care to you today!    Follow up:  1 months  Please pick up your medication at the pharmacy and start taking Robaxin, a muscle relaxant is ordered to take as needed 4 times daily Please start taking olmesartan 20 mg daily for your blood pressure I recommend low sodium diet, less than 1500 mg daily with increased physical activities We will follow-up on your blood pressure in 1 month   Please continue to a heart-healthy diet and increase your physical activities. Try to exercise for at least three times a week.      It was a pleasure to see you and I look forward to continuing to work together on your health and well-being. Please do not hesitate to call the office if you need care or have questions about your care.   Have a wonderful day and week. With Gratitude, Gilmore Laroche MSN, FNP-BC

## 2022-04-14 NOTE — Assessment & Plan Note (Signed)
Uncontrolled We will start patient on olmesartan 20 mg daily He denies headaches, dizziness, blurred vision, chest pain, palpitation Encourage low-sodium diet with increased physical activities BP Readings from Last 3 Encounters:  04/14/22 (!) 158/94  03/31/22 (!) 142/82  01/23/21 134/85

## 2022-04-15 LAB — TSH+FREE T4
Free T4: 1.02 ng/dL (ref 0.82–1.77)
TSH: 1.14 u[IU]/mL (ref 0.450–4.500)

## 2022-04-15 LAB — CBC WITH DIFFERENTIAL/PLATELET
Basophils Absolute: 0 10*3/uL (ref 0.0–0.2)
Basos: 1 %
EOS (ABSOLUTE): 0.2 10*3/uL (ref 0.0–0.4)
Eos: 3 %
Hematocrit: 45.2 % (ref 37.5–51.0)
Hemoglobin: 14.8 g/dL (ref 13.0–17.7)
Immature Grans (Abs): 0 10*3/uL (ref 0.0–0.1)
Immature Granulocytes: 0 %
Lymphocytes Absolute: 2.9 10*3/uL (ref 0.7–3.1)
Lymphs: 44 %
MCH: 25.4 pg — ABNORMAL LOW (ref 26.6–33.0)
MCHC: 32.7 g/dL (ref 31.5–35.7)
MCV: 78 fL — ABNORMAL LOW (ref 79–97)
Monocytes Absolute: 0.5 10*3/uL (ref 0.1–0.9)
Monocytes: 8 %
Neutrophils Absolute: 2.8 10*3/uL (ref 1.4–7.0)
Neutrophils: 44 %
Platelets: 215 10*3/uL (ref 150–450)
RBC: 5.82 x10E6/uL — ABNORMAL HIGH (ref 4.14–5.80)
RDW: 13.9 % (ref 11.6–15.4)
WBC: 6.4 10*3/uL (ref 3.4–10.8)

## 2022-04-15 LAB — CMP14+EGFR
ALT: 29 IU/L (ref 0–44)
AST: 23 IU/L (ref 0–40)
Albumin/Globulin Ratio: 2 (ref 1.2–2.2)
Albumin: 4.7 g/dL (ref 4.1–5.1)
Alkaline Phosphatase: 64 IU/L (ref 44–121)
BUN/Creatinine Ratio: 10 (ref 9–20)
BUN: 11 mg/dL (ref 6–24)
Bilirubin Total: 0.5 mg/dL (ref 0.0–1.2)
CO2: 19 mmol/L — ABNORMAL LOW (ref 20–29)
Calcium: 9.6 mg/dL (ref 8.7–10.2)
Chloride: 99 mmol/L (ref 96–106)
Creatinine, Ser: 1.11 mg/dL (ref 0.76–1.27)
Globulin, Total: 2.4 g/dL (ref 1.5–4.5)
Glucose: 84 mg/dL (ref 70–99)
Potassium: 4.3 mmol/L (ref 3.5–5.2)
Sodium: 136 mmol/L (ref 134–144)
Total Protein: 7.1 g/dL (ref 6.0–8.5)
eGFR: 84 mL/min/{1.73_m2} (ref >59)

## 2022-04-15 LAB — HEPATITIS C ANTIBODY: Hep C Virus Ab: NONREACTIVE

## 2022-04-15 LAB — LIPID PANEL
Chol/HDL Ratio: 3.7 ratio (ref 0.0–5.0)
Cholesterol, Total: 242 mg/dL — ABNORMAL HIGH (ref 100–199)
HDL: 65 mg/dL (ref >39)
LDL Chol Calc (NIH): 156 mg/dL — ABNORMAL HIGH (ref 0–99)
Triglycerides: 119 mg/dL (ref 0–149)
VLDL Cholesterol Cal: 21 mg/dL (ref 5–40)

## 2022-04-15 LAB — HEMOGLOBIN A1C
Est. average glucose Bld gHb Est-mCnc: 123 mg/dL
Hgb A1c MFr Bld: 5.9 % — ABNORMAL HIGH (ref 4.8–5.6)

## 2022-04-15 LAB — VITAMIN D 25 HYDROXY (VIT D DEFICIENCY, FRACTURES): Vit D, 25-Hydroxy: 8.1 ng/mL — ABNORMAL LOW (ref 30.0–100.0)

## 2022-04-27 ENCOUNTER — Other Ambulatory Visit: Payer: Self-pay | Admitting: Family Medicine

## 2022-04-27 DIAGNOSIS — E785 Hyperlipidemia, unspecified: Secondary | ICD-10-CM

## 2022-04-27 DIAGNOSIS — E559 Vitamin D deficiency, unspecified: Secondary | ICD-10-CM

## 2022-04-27 MED ORDER — ROSUVASTATIN CALCIUM 10 MG PO TABS: 10.0000 mg | ORAL_TABLET | Freq: Every day | ORAL | 3 refills | Status: DC

## 2022-04-27 MED ORDER — VITAMIN D (ERGOCALCIFEROL) 1.25 MG (50000 UNIT) PO CAPS: 50000.0000 [IU] | ORAL_CAPSULE | ORAL | 1 refills | Status: DC

## 2022-04-27 NOTE — Progress Notes (Signed)
A weekly vitamin D supplement prescription has been sent to your pharmacy because your vitamin D is low. Your cholesterol levels are elevated. I want your LDL to be less than 100. I recommend implementing lifestyle changes by decreasing your intake of saturated and trans fat, with a low-carb diet, and increasing physical activities. A prescription for rosuvastatin 10 mg has been sent to your pharmacy to help decrease your cholesterol levels. You are prediabetic, I recommend decreasing your intake of foods high in sugar. Your thyroid, kidneys, and liver function are stable.

## 2022-05-12 ENCOUNTER — Ambulatory Visit: Payer: BC Managed Care – PPO | Admitting: Family Medicine

## 2022-05-12 ENCOUNTER — Encounter: Payer: Self-pay | Admitting: Family Medicine

## 2022-05-12 VITALS — BP 138/86 | HR 116 | Ht 66.0 in | Wt 183.1 lb

## 2022-05-12 DIAGNOSIS — I10 Essential (primary) hypertension: Secondary | ICD-10-CM

## 2022-05-12 MED ORDER — OLMESARTAN MEDOXOMIL 20 MG PO TABS: 20.0000 mg | ORAL_TABLET | Freq: Every day | ORAL | 1 refills | Status: DC

## 2022-05-12 NOTE — Patient Instructions (Signed)
I appreciate the opportunity to provide care to you today!    Follow up:  3 months     Please continue to a heart-healthy diet and increase your physical activities. Try to exercise for 82mins at least five times a week.   Physical activity helps: Lower your blood glucose, improve your heart health, lower your blood pressure and cholesterol, burn calories to help manage her weight, gave you energy, lower stress, and improve his sleep.  The American diabetes Association (ADA) recommends being active for 2-1/2 hours (150 minutes) or more week.  Exercise for 30 minutes, 5 days a week (150 minutes total)      It was a pleasure to see you and I look forward to continuing to work together on your health and well-being. Please do not hesitate to call the office if you need care or have questions about your care.   Have a wonderful day and week. With Gratitude, Alvira Monday MSN, FNP-BC

## 2022-05-12 NOTE — Assessment & Plan Note (Signed)
Controlled He takes olmesartan 20 mg daily He denies headaches, dizziness, blurred vision, chest pain, palpitation Encourage low-sodium diet with increased physical activities BP Readings from Last 3 Encounters:  05/12/22 138/86  04/14/22 (!) 158/94  03/31/22 (!) 142/82
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
## 2022-08-17 ENCOUNTER — Encounter: Payer: Self-pay | Admitting: Family Medicine

## 2022-08-17 ENCOUNTER — Ambulatory Visit (INDEPENDENT_AMBULATORY_CARE_PROVIDER_SITE_OTHER): Payer: BC Managed Care – PPO | Admitting: Family Medicine

## 2022-08-17 VITALS — BP 142/80 | HR 120 | Ht 66.0 in | Wt 176.0 lb

## 2022-08-17 DIAGNOSIS — E785 Hyperlipidemia, unspecified: Secondary | ICD-10-CM

## 2022-08-17 DIAGNOSIS — R7301 Impaired fasting glucose: Secondary | ICD-10-CM

## 2022-08-17 DIAGNOSIS — E559 Vitamin D deficiency, unspecified: Secondary | ICD-10-CM | POA: Diagnosis not present

## 2022-08-17 DIAGNOSIS — I10 Essential (primary) hypertension: Secondary | ICD-10-CM | POA: Diagnosis not present

## 2022-08-17 DIAGNOSIS — E038 Other specified hypothyroidism: Secondary | ICD-10-CM

## 2022-08-17 DIAGNOSIS — E7849 Other hyperlipidemia: Secondary | ICD-10-CM

## 2022-08-17 NOTE — Patient Instructions (Addendum)
I appreciate the opportunity to provide care to you today!    Follow up:  2 weeks for BP   Fasting Labs: please stop by the lab during to get your blood drawn (CBC, CMP, TSH, Lipid profile, HgA1c, Vit D)     Please continue to a heart-healthy diet and increase your physical activities. Try to exercise for at least five days a week.      It was a pleasure to see you and I look forward to continuing to work together on your health and well-being. Please do not hesitate to call the office if you need care or have questions about your care.   Have a wonderful day and week. With Gratitude, Gilmore Laroche MSN, FNP-BC
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
## 2022-08-18 DIAGNOSIS — E785 Hyperlipidemia, unspecified: Secondary | ICD-10-CM | POA: Insufficient documentation

## 2022-08-18 NOTE — Assessment & Plan Note (Signed)
Uncontrolled Reports drinking a 12 ounce beer this morning Asymptomatic in the clinic We will follow-up on BP in 2 weeks He takes olmesartan 20 mg daily and reports adherence with treatment regiment Low-sodium diet with increased physical activity encouraged BP Readings from Last 3 Encounters:  08/17/22 (!) 142/80  05/12/22 138/86  04/14/22 (!) 158/94

## 2022-08-18 NOTE — Assessment & Plan Note (Signed)
The patient takes rosuvastatin 10 mg daily No complaints or concerns voiced Lab Results  Component Value Date   CHOL 242 (H) 04/14/2022   HDL 65 04/14/2022   LDLCALC 156 (H) 04/14/2022   TRIG 119 04/14/2022   CHOLHDL 3.7 04/14/2022

## 2022-09-05 LAB — LIPID PANEL
Chol/HDL Ratio: 2.6 ratio (ref 0.0–5.0)
Cholesterol, Total: 162 mg/dL (ref 100–199)
HDL: 63 mg/dL (ref >39)
LDL Chol Calc (NIH): 70 mg/dL (ref 0–99)
Triglycerides: 175 mg/dL — ABNORMAL HIGH (ref 0–149)
VLDL Cholesterol Cal: 29 mg/dL (ref 5–40)

## 2022-09-05 LAB — CBC WITH DIFFERENTIAL/PLATELET
Basophils Absolute: 0.1 10*3/uL (ref 0.0–0.2)
Basos: 1 %
EOS (ABSOLUTE): 0.2 10*3/uL (ref 0.0–0.4)
Eos: 2 %
Hematocrit: 40.7 % (ref 37.5–51.0)
Hemoglobin: 13.3 g/dL (ref 13.0–17.7)
Immature Grans (Abs): 0 10*3/uL (ref 0.0–0.1)
Immature Granulocytes: 0 %
Lymphocytes Absolute: 2.8 10*3/uL (ref 0.7–3.1)
Lymphs: 33 %
MCH: 25.4 pg — ABNORMAL LOW (ref 26.6–33.0)
MCHC: 32.7 g/dL (ref 31.5–35.7)
MCV: 78 fL — ABNORMAL LOW (ref 79–97)
Monocytes Absolute: 0.6 10*3/uL (ref 0.1–0.9)
Monocytes: 7 %
Neutrophils Absolute: 4.8 10*3/uL (ref 1.4–7.0)
Neutrophils: 57 %
Platelets: 265 10*3/uL (ref 150–450)
RBC: 5.24 x10E6/uL (ref 4.14–5.80)
RDW: 14.4 % (ref 11.6–15.4)
WBC: 8.4 10*3/uL (ref 3.4–10.8)

## 2022-09-05 LAB — CMP14+EGFR
ALT: 31 IU/L (ref 0–44)
AST: 26 IU/L (ref 0–40)
Albumin/Globulin Ratio: 1.9 (ref 1.2–2.2)
Albumin: 4.4 g/dL (ref 4.1–5.1)
Alkaline Phosphatase: 60 IU/L (ref 44–121)
BUN/Creatinine Ratio: 11 (ref 9–20)
BUN: 13 mg/dL (ref 6–24)
Bilirubin Total: 0.2 mg/dL (ref 0.0–1.2)
CO2: 25 mmol/L (ref 20–29)
Calcium: 9.3 mg/dL (ref 8.7–10.2)
Chloride: 103 mmol/L (ref 96–106)
Creatinine, Ser: 1.23 mg/dL (ref 0.76–1.27)
Globulin, Total: 2.3 g/dL (ref 1.5–4.5)
Glucose: 89 mg/dL (ref 70–99)
Potassium: 4.1 mmol/L (ref 3.5–5.2)
Sodium: 142 mmol/L (ref 134–144)
Total Protein: 6.7 g/dL (ref 6.0–8.5)
eGFR: 74 mL/min/{1.73_m2} (ref >59)

## 2022-09-05 LAB — TSH+FREE T4
Free T4: 0.97 ng/dL (ref 0.82–1.77)
TSH: 0.925 u[IU]/mL (ref 0.450–4.500)

## 2022-09-05 LAB — HEMOGLOBIN A1C
Est. average glucose Bld gHb Est-mCnc: 123 mg/dL
Hgb A1c MFr Bld: 5.9 % — ABNORMAL HIGH (ref 4.8–5.6)

## 2022-09-05 LAB — VITAMIN D 25 HYDROXY (VIT D DEFICIENCY, FRACTURES): Vit D, 25-Hydroxy: 21.8 ng/mL — ABNORMAL LOW (ref 30.0–100.0)

## 2022-09-07 NOTE — Progress Notes (Signed)
Please inform the patient to continue taking his weekly vitamin D supplement.  His vitamin D is slightly low.  His hemoglobin A1c is 5.9, this indicate that he is prediabetic. I recommend avoiding simple carbohydrates, including cakes, sweet desserts, ice cream, soda (diet or regular), sweet tea, candies, chips, cookies, store-bought juices, alcohol in excess of 1-2 drinks a day, lemonade, artificial sweeteners, donuts, coffee creamers, and sugar-free products.  I recommend avoiding greasy, fatty foods with increased physical activity.   His triglyceride level is elevated.  I recommend that he starts taking over-the-counter fish oil 2000 mg twice daily.  Also rec men decreasing his intake of greasy fatty starchy foods.

## 2022-09-08 ENCOUNTER — Ambulatory Visit: Payer: BC Managed Care – PPO | Admitting: Family Medicine

## 2022-09-08 ENCOUNTER — Encounter: Payer: Self-pay | Admitting: Family Medicine

## 2022-09-08 VITALS — BP 142/98 | HR 89 | Ht 66.0 in | Wt 181.6 lb

## 2022-09-08 DIAGNOSIS — I10 Essential (primary) hypertension: Secondary | ICD-10-CM | POA: Diagnosis not present
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
## 2022-09-08 NOTE — Patient Instructions (Signed)
I appreciate the opportunity to provide care to you today!    Follow up:  1 month for BP   Labs: next visit  Please start taking olmesartan 2 tablets (40 mg total) daily  Please continue low-sodium diet with increased physical activity   Please continue to a heart-healthy diet and increase your physical activities. Try to exercise for at least five days a week.      It was a pleasure to see you and I look forward to continuing to work together on your health and well-being. Please do not hesitate to call the office if you need care or have questions about your care.   Have a wonderful day and week. With Gratitude, Gilmore Laroche MSN, FNP-BC

## 2022-09-08 NOTE — Assessment & Plan Note (Signed)
Uncontrolled Reports compliance with treatment regimen Encouraged to start taking 2 tablets(40 mg total) of olmesartan 20 mg Low-sodium diet with increased physical activity encouraged We will follow-up in 4 weeks BP Readings from Last 3 Encounters:  09/08/22 (!) 142/98  08/17/22 (!) 142/80  05/12/22 138/86

## 2022-10-12 ENCOUNTER — Encounter: Payer: Self-pay | Admitting: Family Medicine

## 2022-10-12 ENCOUNTER — Ambulatory Visit: Payer: BC Managed Care – PPO | Admitting: Family Medicine

## 2022-10-12 VITALS — BP 126/81 | HR 94 | Ht 66.0 in | Wt 175.1 lb

## 2022-10-12 DIAGNOSIS — I10 Essential (primary) hypertension: Secondary | ICD-10-CM | POA: Diagnosis not present

## 2022-10-12 DIAGNOSIS — H6121 Impacted cerumen, right ear: Secondary | ICD-10-CM | POA: Diagnosis not present

## 2022-10-12 DIAGNOSIS — H6120 Impacted cerumen, unspecified ear: Secondary | ICD-10-CM | POA: Insufficient documentation

## 2022-10-12 NOTE — Addendum Note (Signed)
Addended by: Telford Nab on: 10/12/2022 04:33 PM   Modules accepted: Orders

## 2022-10-12 NOTE — Assessment & Plan Note (Signed)
Controlled Asymptomatic in the clinic Encouraged to continue taking olmesartan 40 mg daily Low-sodium diet with increased physical activity encouraged BP Readings from Last 3 Encounters:  10/12/22 126/81  09/08/22 (!) 142/98  08/17/22 (!) 142/80
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
## 2022-10-12 NOTE — Patient Instructions (Addendum)
I appreciate the opportunity to provide care to you today!    Follow up:  3 months  Labs: please stop by the lab today to get your blood drawn (BMP)  Please continue taking olmesartan 40mg  daily  Impacted Cerumen I recommend using otc debrox earwax removal kit   Please continue to a heart-healthy diet and increase your physical activities. Try to exercise for at least five days a week.      It was a pleasure to see you and I look forward to continuing to work together on your health and well-being. Please do not hesitate to call the office if you need care or have questions about your care.   Have a wonderful day and week. With Gratitude, Gilmore Laroche MSN, FNP-BC

## 2022-10-12 NOTE — Assessment & Plan Note (Signed)
Ear irrigation performed.

## 2022-10-13 LAB — BMP8+EGFR
BUN/Creatinine Ratio: 10 (ref 9–20)
BUN: 11 mg/dL (ref 6–24)
CO2: 20 mmol/L (ref 20–29)
Calcium: 9.5 mg/dL (ref 8.7–10.2)
Chloride: 104 mmol/L (ref 96–106)
Creatinine, Ser: 1.09 mg/dL (ref 0.76–1.27)
Glucose: 74 mg/dL (ref 70–99)
Potassium: 4.5 mmol/L (ref 3.5–5.2)
Sodium: 143 mmol/L (ref 134–144)
eGFR: 85 mL/min/{1.73_m2} (ref >59)

## 2022-10-13 NOTE — Progress Notes (Signed)
Please inform the patient that his labs are stable

## 2022-11-27 ENCOUNTER — Ambulatory Visit: Payer: BC Managed Care – PPO | Admitting: Family Medicine

## 2022-11-27 ENCOUNTER — Encounter: Payer: Self-pay | Admitting: Family Medicine

## 2022-11-27 VITALS — BP 140/94 | HR 96 | Ht 66.0 in | Wt 167.0 lb

## 2022-11-27 DIAGNOSIS — I10 Essential (primary) hypertension: Secondary | ICD-10-CM

## 2022-11-27 DIAGNOSIS — H1031 Unspecified acute conjunctivitis, right eye: Secondary | ICD-10-CM | POA: Diagnosis not present

## 2022-11-27 MED ORDER — GENTAMICIN SULFATE 0.3 % OP SOLN: 1.0000 [drp] | Freq: Three times a day (TID) | OPHTHALMIC | 0 refills | Status: AC

## 2022-11-27 NOTE — Progress Notes (Unsigned)
   Billy Lynn     MRN: [id number]      DOB: 1975-06-09  Chief Complaint  Patient presents with   Conjunctivitis    Pink eye x 1 week     HPI Billy Lynn is here c/o redness and drainage, mostly clear , watering from right eye, started 8/4, no significant pain but light is more irritating , and vision essentially unchanged, no trauma, left eye no issues. Denies uncontrolled allergy symptoms  ROS See HPI  Denies recent fever or chills. Denies sinus pressure, nasal congestion, ear pain or sore throat. Denies chest congestion, productive cough or wheezing. Denies chest pains, palpitations and leg swelling . Denies headaches. Denies skin break down or rash.   PE  BP (!) 140/94   Pulse 96   Ht 5\' 6"  (1.676 m)   Wt 167 lb 0.6 oz (75.8 kg)   SpO2 96%   BMI 26.96 kg/m   Patient alert and oriented and in no cardiopulmonary distress.  HEENT: No facial asymmetry, EOMI,     Neck supple .Erythema of right conjunctiva with scant drainage Left conjunctiva , mild erythema , no drainage present in both eyes at time of exam Normal vision screen in both eyes  Chest: Clear to auscultation bilaterally.  CVS: S1, S2 no murmurs, no S3.Regular rate.  Ext: No edema  MS: Adequate ROM spine, shoulders, hips and knees.  Skin: Intact, no periorbital cellulitis  Assessment & Plan  Acute conjunctivitis of right eye Gentamicin euye deop prescribed with close f/u with PCP  Hypertension Elevated at visit will f/u with PCP

## 2022-11-27 NOTE — Patient Instructions (Signed)
F/U with PCP  in 3 weeks re evaluate  right eye, and also uncontrolled blood pressure  Antibiotic eye drop is prescribed for 1 week, need to take as directed, if the redness persists and not better you will need to be seen by Eye Specialist, please keep in touch with office if this is necessary  You have normal vison in both eyes on screening test today in the office  Blood pressure is elevated at this visit PCP will follow up on this  Thanks for choosing [facility], we consider it a privelige to serve you.

## 2022-11-29 ENCOUNTER — Encounter: Payer: Self-pay | Admitting: Family Medicine

## 2022-11-29 NOTE — Assessment & Plan Note (Signed)
Elevated at visit will f/u with PCP

## 2022-11-29 NOTE — Assessment & Plan Note (Signed)
Gentamicin euye deop prescribed with close f/u with PCP

## 2023-01-12 ENCOUNTER — Ambulatory Visit: Payer: BC Managed Care – PPO | Admitting: Family Medicine

## 2023-01-26 ENCOUNTER — Encounter: Payer: Self-pay | Admitting: Family Medicine

## 2023-01-26 ENCOUNTER — Ambulatory Visit: Payer: BC Managed Care – PPO | Admitting: Family Medicine

## 2023-01-26 VITALS — BP 139/96 | HR 123 | Ht 66.0 in | Wt 160.0 lb

## 2023-01-26 DIAGNOSIS — M79645 Pain in left finger(s): Secondary | ICD-10-CM

## 2023-01-26 DIAGNOSIS — I10 Essential (primary) hypertension: Secondary | ICD-10-CM | POA: Diagnosis not present

## 2023-01-26 DIAGNOSIS — Z1211 Encounter for screening for malignant neoplasm of colon: Secondary | ICD-10-CM

## 2023-01-26 DIAGNOSIS — E559 Vitamin D deficiency, unspecified: Secondary | ICD-10-CM

## 2023-01-26 DIAGNOSIS — R7301 Impaired fasting glucose: Secondary | ICD-10-CM | POA: Diagnosis not present

## 2023-01-26 DIAGNOSIS — E038 Other specified hypothyroidism: Secondary | ICD-10-CM

## 2023-01-26 DIAGNOSIS — E7849 Other hyperlipidemia: Secondary | ICD-10-CM

## 2023-01-26 DIAGNOSIS — Z114 Encounter for screening for human immunodeficiency virus [HIV]: Secondary | ICD-10-CM

## 2023-01-26 MED ORDER — AMLODIPINE-OLMESARTAN 10-40 MG PO TABS
1.0000 | ORAL_TABLET | Freq: Every day | ORAL | 3 refills | Status: AC
Start: 2023-01-26 — End: ?

## 2023-01-26 NOTE — Patient Instructions (Addendum)
I appreciate the opportunity to provide care to you today!    Follow up:  1 month for BP  Labs: please stop by the lab during the week to get your blood drawn (CBC, CMP, TSH, Lipid profile, HgA1c, Vit D)  Left thumb pain Please stop by [facility] to get an x-ray of the left thumb You may take over-the-counter Tylenol ibuprofen as needed for pain relief Nonpharmacological Management of Thumb Pain -Rest and Activity Modification:Avoid using the affected thumb for activities that cause pain.Modify daily activities to prevent further strain on the thumb. Ice Therapy:Apply an ice pack wrapped in a cloth to the thumb for 15-20 minutes every hour as needed to reduce swelling and numb pain. Avoid direct contact of ice with the skin to prevent frostbite. Compression:Use a compression bandage or thumb splint to provide support and reduce swelling.Ensure that the compression is snug but not too tight to impede circulation. Elevation:Keep the thumb elevated above heart level while resting to help decrease swelling. Heat Therapy: apply heat (such as a warm towel or heating pad) to the thumb to help relax muscles and alleviate stiffness. Gentle Stretching and Strengthening Exercises:Once pain decreases, gentle stretching and strengthening exercises can be performed to improve flexibility and strength in the thumb. Examples include thumb flexion and extension exercises.   Hypertension Management  Your current blood pressure is above the target goal of <140/90 mmHg. To address this, please start taking amlodipine-olmesartan 10-40 mg daily    Medication Instructions: Take your blood pressure medication at the same time each day. After taking your medication, check your blood pressure at least an hour later. If your first reading is >140/90 mmHg, wait at least 10 minutes and recheck your blood pressure. Side Effects: In the initial days of therapy, you may experience dizziness or lightheadedness as  your body adjusts to the lower blood pressure; this is expected. Diet and Lifestyle: Adhere to a low-sodium diet, limiting intake to less than 1500 mg daily, and increase your physical activity. Avoid over-the-counter NSAIDs such as ibuprofen and naproxen while on this medication. Hydration and Nutrition: Stay well-hydrated by drinking at least 64 ounces of water daily. Increase your servings of fruits and vegetables and avoid excessive sodium in your diet. Long-Term Considerations: Uncontrolled hypertension can increase the risk of cardiovascular diseases, including stroke, coronary artery disease, and heart failure.  Please report to the emergency department if your blood pressure exceeds 180/120 and is accompanied by symptoms such as headaches, chest pain, palpitations, blurred vision, or dizziness.    Referrals today-  GI for colonoscopy     Please continue to a heart-healthy diet and increase your physical activities. Try to exercise for at least five days a week.    It was a pleasure to see you and I look forward to continuing to work together on your health and well-being. Please do not hesitate to call the office if you need care or have questions about your care.  In case of emergency, please visit the Emergency Department for urgent care, or contact our clinic at [phone] to schedule an appointment. We're here to help you!   Have a wonderful day and week. With Gratitude, Gilmore Laroche MSN, FNP-BC
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
## 2023-01-27 ENCOUNTER — Encounter (INDEPENDENT_AMBULATORY_CARE_PROVIDER_SITE_OTHER): Payer: Self-pay | Admitting: *Deleted

## 2023-01-27 DIAGNOSIS — M79645 Pain in left finger(s): Secondary | ICD-10-CM | POA: Insufficient documentation

## 2023-01-27 NOTE — Assessment & Plan Note (Signed)
Left thumb pain Please stop by [facility] to get an x-ray of the left thumb You may take over-the-counter Tylenol ibuprofen as needed for pain relief Nonpharmacological Management of Thumb Pain -Rest and Activity Modification:Avoid using the affected thumb for activities that cause pain.Modify daily activities to prevent further strain on the thumb. Ice Therapy:Apply an ice pack wrapped in a cloth to the thumb for 15-20 minutes every hour as needed to reduce swelling and numb pain. Avoid direct contact of ice with the skin to prevent frostbite. Compression:Use a compression bandage or thumb splint to provide support and reduce swelling.Ensure that the compression is snug but not too tight to impede circulation. Elevation:Keep the thumb elevated above heart level while resting to help decrease swelling. Heat Therapy: apply heat (such as a warm towel or heating pad) to the thumb to help relax muscles and alleviate stiffness. Gentle Stretching and Strengthening Exercises:Once pain decreases, gentle stretching and strengthening exercises can be performed to improve flexibility and strength in the thumb. Examples include thumb flexion and extension exercises.

## 2023-01-27 NOTE — Assessment & Plan Note (Signed)
Hypertension Management  Your current blood pressure is above the target goal of <140/90 mmHg. To address this, please start taking amlodipine-olmesartan 10-40 mg daily    Medication Instructions: Take your blood pressure medication at the same time each day. After taking your medication, check your blood pressure at least an hour later. If your first reading is >140/90 mmHg, wait at least 10 minutes and recheck your blood pressure. Side Effects: In the initial days of therapy, you may experience dizziness or lightheadedness as your body adjusts to the lower blood pressure; this is expected. Diet and Lifestyle: Adhere to a low-sodium diet, limiting intake to less than 1500 mg daily, and increase your physical activity. Avoid over-the-counter NSAIDs such as ibuprofen and naproxen while on this medication. Hydration and Nutrition: Stay well-hydrated by drinking at least 64 ounces of water daily. Increase your servings of fruits and vegetables and avoid excessive sodium in your diet. Long-Term Considerations: Uncontrolled hypertension can increase the risk of cardiovascular diseases, including stroke, coronary artery disease, and heart failure.  Please report to the emergency department if your blood pressure exceeds 180/120 and is accompanied by symptoms such as headaches, chest pain, palpitations, blurred vision, or dizziness.

## 2023-02-01 ENCOUNTER — Other Ambulatory Visit (HOSPITAL_COMMUNITY)
Admission: RE | Admit: 2023-02-01 | Discharge: 2023-02-01 | Disposition: A | Discharge status: Home / Self Care | Payer: BC Managed Care – PPO | Source: Ambulatory Visit | Attending: Family Medicine | Admitting: Family Medicine

## 2023-02-01 DIAGNOSIS — Z114 Encounter for screening for human immunodeficiency virus [HIV]: Secondary | ICD-10-CM | POA: Insufficient documentation

## 2023-02-01 DIAGNOSIS — R7301 Impaired fasting glucose: Secondary | ICD-10-CM | POA: Diagnosis not present

## 2023-02-01 DIAGNOSIS — E7849 Other hyperlipidemia: Secondary | ICD-10-CM | POA: Diagnosis not present

## 2023-02-01 DIAGNOSIS — E038 Other specified hypothyroidism: Secondary | ICD-10-CM | POA: Diagnosis not present

## 2023-02-01 DIAGNOSIS — E559 Vitamin D deficiency, unspecified: Secondary | ICD-10-CM | POA: Insufficient documentation

## 2023-02-01 LAB — COMPREHENSIVE METABOLIC PANEL
ALT: 77 U/L — ABNORMAL HIGH (ref 0–44)
AST: 83 U/L — ABNORMAL HIGH (ref 15–41)
Albumin: 4.4 g/dL (ref 3.5–5.0)
Alkaline Phosphatase: 60 U/L (ref 38–126)
Anion gap: 14 (ref 5–15)
BUN: 20 mg/dL (ref 6–20)
CO2: 24 mmol/L (ref 22–32)
Calcium: 9.6 mg/dL (ref 8.9–10.3)
Chloride: 93 mmol/L — ABNORMAL LOW (ref 98–111)
Creatinine, Ser: 1.18 mg/dL (ref 0.61–1.24)
GFR, Estimated: >60 mL/min (ref >60)
Glucose, Bld: 122 mg/dL — ABNORMAL HIGH (ref 70–99)
Potassium: 3.5 mmol/L (ref 3.5–5.1)
Sodium: 131 mmol/L — ABNORMAL LOW (ref 135–145)
Total Bilirubin: 0.8 mg/dL (ref 0.3–1.2)
Total Protein: 8.1 g/dL (ref 6.5–8.1)

## 2023-02-01 LAB — T4, FREE: Free T4: 0.88 ng/dL (ref 0.61–1.12)

## 2023-02-01 LAB — CBC WITH DIFFERENTIAL/PLATELET
Abs Immature Granulocytes: 0.01 10*3/uL (ref 0.00–0.07)
Basophils Absolute: 0 10*3/uL (ref 0.0–0.1)
Basophils Relative: 0 %
Eosinophils Absolute: 0.1 10*3/uL (ref 0.0–0.5)
Eosinophils Relative: 1 %
HCT: 45.3 % (ref 39.0–52.0)
Hemoglobin: 15.5 g/dL (ref 13.0–17.0)
Immature Granulocytes: 0 %
Lymphocytes Relative: 42 %
Lymphs Abs: 3 10*3/uL (ref 0.7–4.0)
MCH: 26.2 pg (ref 26.0–34.0)
MCHC: 34.2 g/dL (ref 30.0–36.0)
MCV: 76.6 fL — ABNORMAL LOW (ref 80.0–100.0)
Monocytes Absolute: 0.8 10*3/uL (ref 0.1–1.0)
Monocytes Relative: 12 %
Neutro Abs: 3.1 10*3/uL (ref 1.7–7.7)
Neutrophils Relative %: 45 %
Platelets: 147 10*3/uL — ABNORMAL LOW (ref 150–400)
RBC: 5.91 MIL/uL — ABNORMAL HIGH (ref 4.22–5.81)
RDW: 14.4 % (ref 11.5–15.5)
WBC: 7.1 10*3/uL (ref 4.0–10.5)
nRBC: 0 % (ref 0.0–0.2)

## 2023-02-01 LAB — LIPID PANEL
Cholesterol: 252 mg/dL — ABNORMAL HIGH (ref 0–200)
HDL: 108 mg/dL (ref >40)
LDL Cholesterol: 128 mg/dL — ABNORMAL HIGH (ref 0–99)
Total CHOL/HDL Ratio: 2.3 {ratio}
Triglycerides: 81 mg/dL (ref <150)
VLDL: 16 mg/dL (ref 0–40)

## 2023-02-01 LAB — VITAMIN D 25 HYDROXY (VIT D DEFICIENCY, FRACTURES): Vit D, 25-Hydroxy: 24 ng/mL — ABNORMAL LOW (ref 30–100)

## 2023-02-01 LAB — HEMOGLOBIN A1C
Hgb A1c MFr Bld: 5.9 % — ABNORMAL HIGH (ref 4.8–5.6)
Mean Plasma Glucose: 122.63 mg/dL

## 2023-02-01 LAB — HIV ANTIBODY (ROUTINE TESTING W REFLEX): HIV Screen 4th Generation wRfx: NONREACTIVE

## 2023-02-01 LAB — TSH: TSH: 1.465 u[IU]/mL (ref 0.350–4.500)

## 2023-02-09 ENCOUNTER — Other Ambulatory Visit: Payer: Self-pay | Admitting: Family Medicine

## 2023-02-09 DIAGNOSIS — E559 Vitamin D deficiency, unspecified: Secondary | ICD-10-CM

## 2023-02-09 MED ORDER — VITAMIN D (ERGOCALCIFEROL) 1.25 MG (50000 UNIT) PO CAPS: 50000.0000 [IU] | ORAL_CAPSULE | ORAL | 1 refills | Status: DC

## 2023-02-09 NOTE — Progress Notes (Signed)
Please inform the patient that a refill of his vitamin D has been sent to his pharmacy. His labs indicate that he is prediabetic, and I recommend decreasing his intake of high-sugar foods and beverages with increased physical activity. His cholesterol levels are elevated, and I recommend decreasing his intake of greasy, fatty, and starchy foods with increased physical activity.

## 2023-02-26 ENCOUNTER — Encounter: Payer: Self-pay | Admitting: Family Medicine

## 2023-02-26 ENCOUNTER — Ambulatory Visit: Payer: BC Managed Care – PPO | Admitting: Family Medicine

## 2023-02-26 VITALS — BP 138/88 | HR 81 | Ht 66.0 in | Wt 170.0 lb

## 2023-02-26 DIAGNOSIS — I10 Essential (primary) hypertension: Secondary | ICD-10-CM
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
## 2023-02-26 NOTE — Patient Instructions (Addendum)
I appreciate the opportunity to provide care to you today!    Follow up:  4 months   Keep up the good work  Please stop by your local pharmacy and get your Tdap and Shingles vaccine  Referrals today-   Attached with your AVS, you will find valuable resources for self-education. I highly recommend dedicating some time to thoroughly examine them.   Please continue to a heart-healthy diet and increase your physical activities. Try to exercise for at least five days a week.    It was a pleasure to see you and I look forward to continuing to work together on your health and well-being. Please do not hesitate to call the office if you need care or have questions about your care.  In case of emergency, please visit the Emergency Department for urgent care, or contact our clinic at [phone] to schedule an appointment. We're here to help you!   Have a wonderful day and week. With Gratitude, Gilmore Laroche MSN, FNP-BC

## 2023-02-26 NOTE — Assessment & Plan Note (Signed)
Controlled Encouraged to continue take amlodipine olmesartan 10-40 milligrams daily Low-sodium diet with increased physical activity encouraged BP Readings from Last 3 Encounters:  02/26/23 138/88  01/26/23 (!) 139/96  11/27/22 (!) 140/94

## 2023-06-28 ENCOUNTER — Ambulatory Visit: Payer: BC Managed Care – PPO | Admitting: Family Medicine

## 2023-07-28 ENCOUNTER — Encounter (INDEPENDENT_AMBULATORY_CARE_PROVIDER_SITE_OTHER): Payer: Self-pay | Admitting: *Deleted

## 2023-07-28 ENCOUNTER — Other Ambulatory Visit: Payer: Self-pay | Admitting: Family Medicine

## 2023-07-28 DIAGNOSIS — E785 Hyperlipidemia, unspecified: Secondary | ICD-10-CM

## 2023-08-30 ENCOUNTER — Encounter (HOSPITAL_COMMUNITY): Payer: Self-pay

## 2023-09-06 ENCOUNTER — Encounter: Payer: Self-pay | Admitting: Family Medicine

## 2023-09-06 ENCOUNTER — Ambulatory Visit: Payer: Self-pay | Admitting: Family Medicine

## 2023-09-06 VITALS — BP 137/86 | HR 88 | Resp 16 | Ht 66.0 in | Wt 167.0 lb

## 2023-09-06 DIAGNOSIS — E559 Vitamin D deficiency, unspecified: Secondary | ICD-10-CM | POA: Diagnosis not present

## 2023-09-06 DIAGNOSIS — E785 Hyperlipidemia, unspecified: Secondary | ICD-10-CM

## 2023-09-06 DIAGNOSIS — R7301 Impaired fasting glucose: Secondary | ICD-10-CM

## 2023-09-06 DIAGNOSIS — E7849 Other hyperlipidemia: Secondary | ICD-10-CM

## 2023-09-06 DIAGNOSIS — I10 Essential (primary) hypertension: Secondary | ICD-10-CM | POA: Diagnosis not present

## 2023-09-06 DIAGNOSIS — E038 Other specified hypothyroidism: Secondary | ICD-10-CM
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
## 2023-09-06 NOTE — Assessment & Plan Note (Signed)
 The patient takes rosuvastatin  10 mg daily  Lifestyle modifications were also discussed, including avoiding simple carbohydrates such as cakes, sweet desserts, ice cream, soda (diet or regular), sweet tea, candies, chips, cookies, store-bought juices, excessive alcohol (more than 1-2 drinks per day), lemonade, artificial sweeteners, donuts, coffee creamers, and sugar-free products. Additionally, the patient was advised to reduce the consumption of greasy, fatty foods and increase physical activity to support cardiovascular health. The patient verbalized understanding and is aware of the plan of care.  Lab Results  Component Value Date   CHOL 252 (H) 02/01/2023   HDL 108 02/01/2023   LDLCALC 128 (H) 02/01/2023   TRIG 81 02/01/2023   CHOLHDL 2.3 02/01/2023

## 2023-09-06 NOTE — Assessment & Plan Note (Signed)
 Controlled Encouraged to continue take amlodipine  olmesartan  10-40 milligrams daily Low-sodium diet with increased physical activity encouraged BP Readings from Last 3 Encounters:  09/06/23 137/86  02/26/23 138/88  01/26/23 (!) 139/96

## 2023-09-06 NOTE — Patient Instructions (Addendum)
 I appreciate the opportunity to provide care to you today!    Follow up:  4 months  Labs: please stop by the lab during the week to get your blood drawn (CBC, CMP, TSH, Lipid profile, HgA1c, Vit D)  For a Healthier YOU, I Recommend: Reducing your intake of sugar, sodium, carbohydrates, and saturated fats. Increasing your fiber intake by incorporating more whole grains, fruits, and vegetables into your meals. Setting healthy goals with a focus on lowering your consumption of carbs, sugar, and unhealthy fats. Adding variety to your diet by including a wide range of fruits and vegetables. Cutting back on soda and limiting processed foods as much as possible. Staying active: In addition to taking your weight loss medication, aim for at least 150 minutes of moderate-intensity physical activity each week for optimal results.      Please continue to a heart-healthy diet and increase your physical activities. Try to exercise for at least five days a week.    It was a pleasure to see you and I look forward to continuing to work together on your health and well-being. Please do not hesitate to call the office if you need care or have questions about your care.  In case of emergency, please visit the Emergency Department for urgent care, or contact our clinic at [phone] to schedule an appointment. We're here to help you!   Have a wonderful day and week. With Gratitude, Doyl Bitting MSN, FNP-BC

## 2023-09-10 ENCOUNTER — Ambulatory Visit: Payer: Self-pay | Admitting: Family Medicine

## 2023-09-10 DIAGNOSIS — E559 Vitamin D deficiency, unspecified: Secondary | ICD-10-CM

## 2023-09-10 LAB — CBC WITH DIFFERENTIAL/PLATELET
Basophils Absolute: 0 10*3/uL (ref 0.0–0.2)
Basos: 1 %
EOS (ABSOLUTE): 0.2 10*3/uL (ref 0.0–0.4)
Eos: 2 %
Hematocrit: 39.4 % (ref 37.5–51.0)
Hemoglobin: 12.9 g/dL — ABNORMAL LOW (ref 13.0–17.7)
Immature Grans (Abs): 0 10*3/uL (ref 0.0–0.1)
Immature Granulocytes: 0 %
Lymphocytes Absolute: 3.1 10*3/uL (ref 0.7–3.1)
Lymphs: 38 %
MCH: 25.9 pg — ABNORMAL LOW (ref 26.6–33.0)
MCHC: 32.7 g/dL (ref 31.5–35.7)
MCV: 79 fL (ref 79–97)
Monocytes Absolute: 0.6 10*3/uL (ref 0.1–0.9)
Monocytes: 7 %
Neutrophils Absolute: 4.2 10*3/uL (ref 1.4–7.0)
Neutrophils: 52 %
Platelets: 242 10*3/uL (ref 150–450)
RBC: 4.98 x10E6/uL (ref 4.14–5.80)
RDW: 14.7 % (ref 11.6–15.4)
WBC: 8.1 10*3/uL (ref 3.4–10.8)

## 2023-09-10 LAB — LIPID PANEL
Chol/HDL Ratio: 2.4 ratio (ref 0.0–5.0)
Cholesterol, Total: 161 mg/dL (ref 100–199)
HDL: 66 mg/dL (ref >39)
LDL Chol Calc (NIH): 66 mg/dL (ref 0–99)
Triglycerides: 178 mg/dL — ABNORMAL HIGH (ref 0–149)
VLDL Cholesterol Cal: 29 mg/dL (ref 5–40)

## 2023-09-10 LAB — CMP14+EGFR
ALT: 24 IU/L (ref 0–44)
AST: 22 IU/L (ref 0–40)
Albumin: 4.5 g/dL (ref 4.1–5.1)
Alkaline Phosphatase: 65 IU/L (ref 44–121)
BUN/Creatinine Ratio: 12 (ref 9–20)
BUN: 13 mg/dL (ref 6–24)
Bilirubin Total: 0.2 mg/dL (ref 0.0–1.2)
CO2: 19 mmol/L — ABNORMAL LOW (ref 20–29)
Calcium: 9.1 mg/dL (ref 8.7–10.2)
Chloride: 102 mmol/L (ref 96–106)
Creatinine, Ser: 1.05 mg/dL (ref 0.76–1.27)
Globulin, Total: 2.7 g/dL (ref 1.5–4.5)
Glucose: 100 mg/dL — ABNORMAL HIGH (ref 70–99)
Potassium: 4.1 mmol/L (ref 3.5–5.2)
Sodium: 138 mmol/L (ref 134–144)
Total Protein: 7.2 g/dL (ref 6.0–8.5)
eGFR: 89 mL/min/{1.73_m2} (ref >59)

## 2023-09-10 LAB — VITAMIN D 25 HYDROXY (VIT D DEFICIENCY, FRACTURES): Vit D, 25-Hydroxy: 16.1 ng/mL — ABNORMAL LOW (ref 30.0–100.0)

## 2023-09-10 LAB — TSH+FREE T4
Free T4: 0.85 ng/dL (ref 0.82–1.77)
TSH: 1.16 u[IU]/mL (ref 0.450–4.500)

## 2023-09-10 LAB — HEMOGLOBIN A1C
Est. average glucose Bld gHb Est-mCnc: 114 mg/dL
Hgb A1c MFr Bld: 5.6 % (ref 4.8–5.6)

## 2023-09-10 MED ORDER — VITAMIN D (ERGOCALCIFEROL) 1.25 MG (50000 UNIT) PO CAPS: 50000.0000 [IU] | ORAL_CAPSULE | ORAL | 1 refills | Status: AC

## 2023-09-29 ENCOUNTER — Telehealth: Payer: Self-pay

## 2023-09-29 NOTE — Telephone Encounter (Signed)
 Copied from CRM [id number]. Topic: Clinical - Medication Question >> Sep 29, 2023  9:09 AM Carlatta H wrote: Reason for CRM: Patient would like a medication called in for hemorrhoids//Please call the patient to advise//[facility] - [place], [place] - [address] [place] [address] Phone: [phone] Fax: [phone] Hours: Not open 24 hours

## 2023-10-08 ENCOUNTER — Other Ambulatory Visit: Payer: Self-pay | Admitting: Family Medicine

## 2023-10-08 DIAGNOSIS — K648 Other hemorrhoids: Secondary | ICD-10-CM

## 2023-10-08 MED ORDER — HYDROCORTISONE (PERIANAL) 2.5 % EX CREA
1.0000 | TOPICAL_CREAM | Freq: Two times a day (BID) | CUTANEOUS | 0 refills | Status: AC
Start: 2023-10-08 — End: ?

## 2023-10-08 NOTE — Telephone Encounter (Signed)
 Patient advised.

## 2024-01-10 ENCOUNTER — Ambulatory Visit: Admitting: Family Medicine

## 2024-05-08 ENCOUNTER — Ambulatory Visit: Admitting: Family Medicine

## 2024-07-17 ENCOUNTER — Ambulatory Visit
